# Patient Record
Sex: Female | Born: 1940 | Race: White | Hispanic: No | Marital: Married | Smoking: Current every day smoker
Health system: Southern US, Community
[De-identification: ages and names within clinical notes are randomized; demographics above are authoritative.]

## PROBLEM LIST (undated history)

## (undated) DIAGNOSIS — R002 Palpitations: Secondary | ICD-10-CM

## (undated) DIAGNOSIS — R0600 Dyspnea, unspecified: Secondary | ICD-10-CM

## (undated) DIAGNOSIS — E785 Hyperlipidemia, unspecified: Secondary | ICD-10-CM

## (undated) DIAGNOSIS — I739 Peripheral vascular disease, unspecified: Secondary | ICD-10-CM

## (undated) DIAGNOSIS — Z9289 Personal history of other medical treatment: Secondary | ICD-10-CM

## (undated) DIAGNOSIS — F329 Major depressive disorder, single episode, unspecified: Secondary | ICD-10-CM

## (undated) DIAGNOSIS — B019 Varicella without complication: Secondary | ICD-10-CM

## (undated) DIAGNOSIS — M1990 Unspecified osteoarthritis, unspecified site: Secondary | ICD-10-CM

## (undated) DIAGNOSIS — T7840XA Allergy, unspecified, initial encounter: Secondary | ICD-10-CM

## (undated) DIAGNOSIS — Z720 Tobacco use: Secondary | ICD-10-CM

## (undated) DIAGNOSIS — F32A Depression, unspecified: Secondary | ICD-10-CM

## (undated) HISTORY — DX: Peripheral vascular disease, unspecified: I73.9

## (undated) HISTORY — DX: Personal history of other medical treatment: Z92.89

## (undated) HISTORY — DX: Unspecified osteoarthritis, unspecified site: M19.90

## (undated) HISTORY — DX: Palpitations: R00.2

## (undated) HISTORY — DX: Hyperlipidemia, unspecified: E78.5

## (undated) HISTORY — PX: ABDOMINAL HYSTERECTOMY: SHX81

## (undated) HISTORY — DX: Varicella without complication: B01.9

## (undated) HISTORY — DX: Tobacco use: Z72.0

## (undated) HISTORY — DX: Major depressive disorder, single episode, unspecified: F32.9

## (undated) HISTORY — PX: CHOLECYSTECTOMY: SHX55

## (undated) HISTORY — DX: Depression, unspecified: F32.A

## (undated) HISTORY — DX: Allergy, unspecified, initial encounter: T78.40XA

---

## 2005-09-30 ENCOUNTER — Encounter: Admission: RE | Admit: 2005-09-30 | Discharge: 2005-09-30 | Payer: Self-pay | Admitting: Family Medicine

## 2005-12-10 ENCOUNTER — Other Ambulatory Visit: Admission: RE | Admit: 2005-12-10 | Discharge: 2005-12-10 | Payer: Self-pay | Admitting: Family Medicine

## 2013-01-15 DIAGNOSIS — H60509 Unspecified acute noninfective otitis externa, unspecified ear: Secondary | ICD-10-CM | POA: Diagnosis not present

## 2013-01-31 DIAGNOSIS — H60509 Unspecified acute noninfective otitis externa, unspecified ear: Secondary | ICD-10-CM | POA: Diagnosis not present

## 2013-01-31 DIAGNOSIS — J309 Allergic rhinitis, unspecified: Secondary | ICD-10-CM | POA: Diagnosis not present

## 2014-06-10 DIAGNOSIS — H43819 Vitreous degeneration, unspecified eye: Secondary | ICD-10-CM | POA: Diagnosis not present

## 2014-07-25 DIAGNOSIS — H2510 Age-related nuclear cataract, unspecified eye: Secondary | ICD-10-CM | POA: Diagnosis not present

## 2014-08-14 DIAGNOSIS — H2510 Age-related nuclear cataract, unspecified eye: Secondary | ICD-10-CM | POA: Diagnosis not present

## 2014-09-19 DIAGNOSIS — H9209 Otalgia, unspecified ear: Secondary | ICD-10-CM | POA: Diagnosis not present

## 2014-09-19 DIAGNOSIS — Z23 Encounter for immunization: Secondary | ICD-10-CM | POA: Diagnosis not present

## 2014-10-09 ENCOUNTER — Ambulatory Visit: Payer: Self-pay | Admitting: Ophthalmology

## 2014-10-09 DIAGNOSIS — R0602 Shortness of breath: Secondary | ICD-10-CM | POA: Diagnosis not present

## 2014-10-09 DIAGNOSIS — E780 Pure hypercholesterolemia: Secondary | ICD-10-CM | POA: Diagnosis not present

## 2014-10-09 DIAGNOSIS — I10 Essential (primary) hypertension: Secondary | ICD-10-CM | POA: Diagnosis not present

## 2014-10-09 DIAGNOSIS — Z9889 Other specified postprocedural states: Secondary | ICD-10-CM | POA: Diagnosis not present

## 2014-10-09 DIAGNOSIS — Z01810 Encounter for preprocedural cardiovascular examination: Secondary | ICD-10-CM | POA: Diagnosis not present

## 2014-10-09 DIAGNOSIS — H2510 Age-related nuclear cataract, unspecified eye: Secondary | ICD-10-CM | POA: Diagnosis not present

## 2014-10-09 DIAGNOSIS — Z720 Tobacco use: Secondary | ICD-10-CM | POA: Diagnosis not present

## 2014-10-09 DIAGNOSIS — H2511 Age-related nuclear cataract, right eye: Secondary | ICD-10-CM | POA: Diagnosis not present

## 2014-10-22 ENCOUNTER — Ambulatory Visit: Payer: Self-pay | Admitting: Ophthalmology

## 2014-10-22 DIAGNOSIS — H2511 Age-related nuclear cataract, right eye: Secondary | ICD-10-CM | POA: Diagnosis not present

## 2014-10-22 DIAGNOSIS — R609 Edema, unspecified: Secondary | ICD-10-CM | POA: Diagnosis not present

## 2014-10-22 DIAGNOSIS — E780 Pure hypercholesterolemia: Secondary | ICD-10-CM | POA: Diagnosis not present

## 2014-10-22 DIAGNOSIS — H269 Unspecified cataract: Secondary | ICD-10-CM | POA: Diagnosis not present

## 2014-10-22 DIAGNOSIS — R0602 Shortness of breath: Secondary | ICD-10-CM | POA: Diagnosis not present

## 2014-10-22 DIAGNOSIS — F17200 Nicotine dependence, unspecified, uncomplicated: Secondary | ICD-10-CM | POA: Diagnosis not present

## 2014-10-22 DIAGNOSIS — M1990 Unspecified osteoarthritis, unspecified site: Secondary | ICD-10-CM | POA: Diagnosis not present

## 2014-10-22 DIAGNOSIS — F329 Major depressive disorder, single episode, unspecified: Secondary | ICD-10-CM | POA: Diagnosis not present

## 2014-10-22 DIAGNOSIS — Z79899 Other long term (current) drug therapy: Secondary | ICD-10-CM | POA: Diagnosis not present

## 2014-10-22 HISTORY — PX: CATARACT EXTRACTION: SUR2

## 2014-10-24 ENCOUNTER — Telehealth: Payer: Self-pay | Admitting: Internal Medicine

## 2014-10-24 NOTE — Telephone Encounter (Signed)
Pt made new pt appointment for 12/10/14.  She wanted to know if she could come in sooner.  She stated she had cataract surgery 10/22/14 @ [facility]   Dr Azucena Cecilprofilio @ [facility] and they told her, that her ekg was abnormal .  They told her it looked like she had a slight stroke or heart attack.

## 2014-10-25 NOTE — Telephone Encounter (Signed)
Ok to put her in one of my physical/consults spots in the afternoon so that we can get her in a little earlier.

## 2014-10-29 NOTE — Telephone Encounter (Signed)
Appointment 11/6 °

## 2014-11-01 ENCOUNTER — Ambulatory Visit (INDEPENDENT_AMBULATORY_CARE_PROVIDER_SITE_OTHER): Payer: Medicare Other | Admitting: Internal Medicine

## 2014-11-01 ENCOUNTER — Encounter: Payer: Self-pay | Admitting: Internal Medicine

## 2014-11-01 ENCOUNTER — Encounter (INDEPENDENT_AMBULATORY_CARE_PROVIDER_SITE_OTHER): Payer: Self-pay

## 2014-11-01 VITALS — BP 112/58 | HR 80 | Temp 98.2°F | Ht 66.25 in | Wt 97.0 lb

## 2014-11-01 DIAGNOSIS — Z636 Dependent relative needing care at home: Secondary | ICD-10-CM

## 2014-11-01 DIAGNOSIS — J302 Other seasonal allergic rhinitis: Secondary | ICD-10-CM | POA: Diagnosis not present

## 2014-11-01 DIAGNOSIS — E785 Hyperlipidemia, unspecified: Secondary | ICD-10-CM | POA: Diagnosis not present

## 2014-11-01 DIAGNOSIS — G4700 Insomnia, unspecified: Secondary | ICD-10-CM | POA: Diagnosis not present

## 2014-11-01 DIAGNOSIS — F329 Major depressive disorder, single episode, unspecified: Secondary | ICD-10-CM | POA: Diagnosis not present

## 2014-11-01 DIAGNOSIS — M479 Spondylosis, unspecified: Secondary | ICD-10-CM

## 2014-11-01 DIAGNOSIS — E46 Unspecified protein-calorie malnutrition: Secondary | ICD-10-CM

## 2014-11-01 DIAGNOSIS — F32A Depression, unspecified: Secondary | ICD-10-CM | POA: Insufficient documentation

## 2014-11-01 LAB — COMPREHENSIVE METABOLIC PANEL
ALT: 17 U/L (ref 0–35)
AST: 20 U/L (ref 0–37)
Albumin: 3.8 g/dL (ref 3.5–5.2)
Alkaline Phosphatase: 105 U/L (ref 39–117)
BUN: 11 mg/dL (ref 6–23)
CHLORIDE: 105 meq/L (ref 96–112)
CO2: 28 mEq/L (ref 19–32)
Calcium: 10.5 mg/dL (ref 8.4–10.5)
Creatinine, Ser: 0.9 mg/dL (ref 0.4–1.2)
GFR: 63.56 mL/min (ref >60.00)
Glucose, Bld: 92 mg/dL (ref 70–99)
Potassium: 4.5 mEq/L (ref 3.5–5.1)
Sodium: 140 mEq/L (ref 135–145)
Total Bilirubin: 0.9 mg/dL (ref 0.2–1.2)
Total Protein: 7.1 g/dL (ref 6.0–8.3)

## 2014-11-01 LAB — LIPID PANEL
CHOLESTEROL: 221 mg/dL — AB (ref 0–200)
HDL: 78.8 mg/dL (ref >39.00)
LDL Cholesterol: 127 mg/dL — ABNORMAL HIGH (ref 0–99)
NonHDL: 142.2
Total CHOL/HDL Ratio: 3
Triglycerides: 75 mg/dL (ref 0.0–149.0)
VLDL: 15 mg/dL (ref 0.0–40.0)

## 2014-11-01 LAB — CBC
HCT: 42.5 % (ref 36.0–46.0)
HEMOGLOBIN: 13.9 g/dL (ref 12.0–15.0)
MCHC: 32.7 g/dL (ref 30.0–36.0)
MCV: 97.6 fl (ref 78.0–100.0)
Platelets: 241 10*3/uL (ref 150.0–400.0)
RBC: 4.36 Mil/uL (ref 3.87–5.11)
RDW: 12.7 % (ref 11.5–15.5)
WBC: 8.3 10*3/uL (ref 4.0–10.5)

## 2014-11-01 LAB — TSH: TSH: 1.19 u[IU]/mL (ref 0.35–4.50)

## 2014-11-01 MED ORDER — MIRTAZAPINE 15 MG PO TABS: 15.0000 mg | ORAL_TABLET | Freq: Every day | ORAL | Status: DC

## 2014-11-01 NOTE — Assessment & Plan Note (Addendum)
Related to caregiver stress Support offered today Will check CBC, CMET and TSH Will start low dose remeron  RTC in 1 month for followup depression

## 2014-11-01 NOTE — Assessment & Plan Note (Signed)
Does not take anything OTC for this Declines prescription RX

## 2014-11-01 NOTE — Assessment & Plan Note (Signed)
Will take OTC antihistamine when symptoms present

## 2014-11-01 NOTE — Assessment & Plan Note (Signed)
Related to depression Should improve with remeron

## 2014-11-01 NOTE — Progress Notes (Signed)
HPI  Pt presents to the clinic today to establish care. She has not had a PCP in many years.  Flu: 08/2014 Tetanus: unsure of last one Pneumonia vaccine: 08/2014 Zostovax: never Pap Smear: > 5 years ago Mammogram: > 5 years ago, Hysterectomy Bone Density: > 5 years ago Colon Screening: > 10 years ago Vision Screening: 09/2014- yearly Dentist: no dentures  Arthritis: Mostly in her back. Worse with standing for long periods of time. Does not taken anything for this OTC.  Depression: Is stressed, caring for her sister who has been diagnosed with Alzheimers. She reports that she has been on medication for depression in the past. She was treated with Elavil which seemed to work well for her. Denies SI/HI. She does feel like she would benefit from restarting a medication for depression.  Hyperlipidemia: Has not had this checked in a long time. Does report being on medication in the past, but unable to remember the name. Her appetite consist of mainly healthy foods- rare junk food.  Seasonal Allergies: Takes an antihistamine occassionally for this. It works well for symptom control.  Past Medical History  Diagnosis Date  . Chicken pox   . Allergy   . Arthritis   . Depression   . Hyperlipidemia     No current outpatient prescriptions on file.   No current facility-administered medications for this visit.    No Known Allergies  Family History  Problem Relation Age of Onset  . Hyperlipidemia Mother   . Heart disease Mother   . Hypertension Mother   . Alcohol abuse Sister   . Alzheimer's disease Sister   . Heart disease Sister   . Hypertension Sister   . Alcohol abuse Brother   . Hyperlipidemia Brother   . Heart disease Brother   . Stroke Brother   . Hyperlipidemia Brother     History   Social History  . Marital Status: Single    Spouse Name: N/A    Number of Children: N/A  . Years of Education: N/A   Occupational History  . Not on file.   Social History Main  Topics  . Smoking status: Current Every Day Smoker -- 1.00 packs/day    Types: Cigarettes  . Smokeless tobacco: Never Used  . Alcohol Use: No  . Drug Use: Not on file  . Sexual Activity: Not on file   Other Topics Concern  . Not on file   Social History Narrative  . No narrative on file    ROS:  Constitutional: Pt reports fatigue. Denies fever, malaise, headache or abrupt weight changes.  HEENT: Pt reports itching in her ears. Denies eye pain, eye redness, ear pain, ringing in the ears, wax buildup, runny nose, nasal congestion, bloody nose, or sore throat. Respiratory: Pt reports shortness of breath. Denies difficulty breathing, cough or sputum production.   Cardiovascular: Denies chest pain, chest tightness, palpitations or swelling in the hands or feet.  Gastrointestinal: Pt reports constipation. Denies abdominal pain, bloating, constipation, diarrhea or blood in the stool.  GU: Denies frequency, urgency, pain with urination, blood in urine, odor or discharge. Musculoskeletal: Pt reports arthritis in her back. Denies decrease in range of motion, difficulty with gait, muscle pain or joint swelling.  Skin: Denies redness, rashes, lesions or ulcercations.  Neurological: Pt reports difficulty with memory. Denies dizziness, difficulty with speech or problems with balance and coordination.  Psych: Pt reports depression and stress. Denies anxiety, SI/HI.   No other specific complaints in a complete review of  systems (except as listed in HPI above).  PE:  BP 112/58 mmHg  Pulse 80  Temp(Src) 98.2 F (36.8 C) (Oral)  Ht 5' 6.25" (1.683 m)  Wt 97 lb (43.999 kg)  BMI 15.53 kg/m2  SpO2 98%  Wt Readings from Last 3 Encounters:  11/01/14 97 lb (43.999 kg)    General: Appears her stated age, malnourished but in NAD. HEENT: Head: normal shape and size;  Ears: canal with red/dry skin, Tm's gray and intact, normal light reflex; Throat/Mouth: False teeth present, mucosa pink and moist,  no lesions or ulcerations noted.  Neck: Neck supple, trachea midline. No masses, lumps or thyromegaly present.  Cardiovascular: Normal rate and rhythm. S1,S2 noted.  No murmur, rubs or gallops noted. No JVD or BLE edema. No carotid bruits noted. Pulmonary/Chest: Normal effort and positive vesicular breath sounds. No respiratory distress. No wheezes, rales or ronchi noted.  Abdomen: Soft and nontender. Normal bowel sounds, no bruits noted. No distention or masses noted. Liver, spleen and kidneys non palpable. Musculoskeletal: Normal flexion and extension of the spine. No pain with palpation of the lumbar spine. No difficulty with gait.  Neurological: Alert and oriented.  Psychiatric: Mood tearful and affect flat.    Assessment and Plan:

## 2014-11-01 NOTE — Assessment & Plan Note (Signed)
Has not had this checked in a long time Will check lipid profile today

## 2014-11-01 NOTE — Progress Notes (Signed)
Pre visit review using our clinic review tool, if applicable. No additional management support is needed unless otherwise documented below in the visit note. 

## 2014-11-01 NOTE — Patient Instructions (Signed)

## 2014-11-01 NOTE — Assessment & Plan Note (Signed)
Poor appetite due to depression Should improve with remeron Will check CBC, CMET and TSH

## 2014-11-04 ENCOUNTER — Telehealth: Payer: Self-pay | Admitting: Internal Medicine

## 2014-11-04 NOTE — Telephone Encounter (Signed)
emmi mailed  °

## 2014-12-02 ENCOUNTER — Encounter: Payer: Self-pay | Admitting: Internal Medicine

## 2014-12-02 ENCOUNTER — Ambulatory Visit (INDEPENDENT_AMBULATORY_CARE_PROVIDER_SITE_OTHER): Payer: Medicare Other | Admitting: Internal Medicine

## 2014-12-02 VITALS — BP 118/80 | HR 102 | Temp 97.7°F | Wt 97.0 lb

## 2014-12-02 DIAGNOSIS — G4700 Insomnia, unspecified: Secondary | ICD-10-CM

## 2014-12-02 DIAGNOSIS — F32A Depression, unspecified: Secondary | ICD-10-CM

## 2014-12-02 DIAGNOSIS — F329 Major depressive disorder, single episode, unspecified: Secondary | ICD-10-CM | POA: Diagnosis not present

## 2014-12-02 DIAGNOSIS — E46 Unspecified protein-calorie malnutrition: Secondary | ICD-10-CM | POA: Diagnosis not present

## 2014-12-02 DIAGNOSIS — R9431 Abnormal electrocardiogram [ECG] [EKG]: Secondary | ICD-10-CM | POA: Diagnosis not present

## 2014-12-02 NOTE — Assessment & Plan Note (Signed)
She has not lost weight but she has also not gained weight Will increase remeron to 30 mg QHS

## 2014-12-02 NOTE — Assessment & Plan Note (Signed)
No improvement Will increase remeron to 30 mgQHS 

## 2014-12-02 NOTE — Patient Instructions (Signed)
Insomnia Insomnia is frequent trouble falling and/or staying asleep. Insomnia can be a long term problem or a short term problem. Both are common. Insomnia can be a short term problem when the wakefulness is related to a certain stress or worry. Long term insomnia is often related to ongoing stress during waking hours and/or poor sleeping habits. Overtime, sleep deprivation itself can make the problem worse. Every little thing feels more severe because you are overtired and your ability to cope is decreased. CAUSES   Stress, anxiety, and depression.  Poor sleeping habits.  Distractions such as TV in the bedroom.  Naps close to bedtime.  Engaging in emotionally charged conversations before bed.  Technical reading before sleep.  Alcohol and other sedatives. They may make the problem worse. They can hurt normal sleep patterns and normal dream activity.  Stimulants such as caffeine for several hours prior to bedtime.  Pain syndromes and shortness of breath can cause insomnia.  Exercise late at night.  Changing time zones may cause sleeping problems (jet lag). It is sometimes helpful to have someone observe your sleeping patterns. They should look for periods of not breathing during the night (sleep apnea). They should also look to see how long those periods last. If you live alone or observers are uncertain, you can also be observed at a sleep clinic where your sleep patterns will be professionally monitored. Sleep apnea requires a checkup and treatment. Give your caregivers your medical history. Give your caregivers observations your family has made about your sleep.  SYMPTOMS   Not feeling rested in the morning.  Anxiety and restlessness at bedtime.  Difficulty falling and staying asleep. TREATMENT   Your caregiver may prescribe treatment for an underlying medical disorders. Your caregiver can give advice or help if you are using alcohol or other drugs for self-medication. Treatment  of underlying problems will usually eliminate insomnia problems.  Medications can be prescribed for short time use. They are generally not recommended for lengthy use.  Over-the-counter sleep medicines are not recommended for lengthy use. They can be habit forming.  You can promote easier sleeping by making lifestyle changes such as:  Using relaxation techniques that help with breathing and reduce muscle tension.  Exercising earlier in the day.  Changing your diet and the time of your last meal. No night time snacks.  Establish a regular time to go to bed.  Counseling can help with stressful problems and worry.  Soothing music and white noise may be helpful if there are background noises you cannot remove.  Stop tedious detailed work at least one hour before bedtime. HOME CARE INSTRUCTIONS   Keep a diary. Inform your caregiver about your progress. This includes any medication side effects. See your caregiver regularly. Take note of:  Times when you are asleep.  Times when you are awake during the night.  The quality of your sleep.  How you feel the next day. This information will help your caregiver care for you.  Get out of bed if you are still awake after 15 minutes. Read or do some quiet activity. Keep the lights down. Wait until you feel sleepy and go back to bed.  Keep regular sleeping and waking hours. Avoid naps.  Exercise regularly.  Avoid distractions at bedtime. Distractions include watching television or engaging in any intense or detailed activity like attempting to balance the household checkbook.  Develop a bedtime ritual. Keep a familiar routine of bathing, brushing your teeth, climbing into bed at the same   time each night, listening to soothing music. Routines increase the success of falling to sleep faster.  Use relaxation techniques. This can be using breathing and muscle tension release routines. It can also include visualizing peaceful scenes. You can  also help control troubling or intruding thoughts by keeping your mind occupied with boring or repetitive thoughts like the old concept of counting sheep. You can make it more creative like imagining planting one beautiful flower after another in your backyard garden.  During your day, work to eliminate stress. When this is not possible use some of the previous suggestions to help reduce the anxiety that accompanies stressful situations. MAKE SURE YOU:   Understand these instructions.  Will watch your condition.  Will get help right away if you are not doing well or get worse. Document Released: 12/10/2000 Document Revised: 03/06/2012 Document Reviewed: 01/10/2008 ExitCare Patient Information 2015 [employer]. This information is not intended to replace advice given to you by your health care provider. Make sure you discuss any questions you have with your health care provider.  

## 2014-12-02 NOTE — Progress Notes (Signed)
Subjective:    Patient ID: Pamela Moon, female    DOB: 1941-02-27, 73 y.o.   MRN: [id number]  HPI  Pt presents to the clinic today to follow up depression, insomnia and malnutrition. She was started on remeron at her last visit. Since that time, she has not lost or gained any weight. She is able to fall asleep but she is still waking up 2-3 times per night. She does feel the need to have to get up to use the bathroom. She is not sure what causes her to wake up, she feels like she just can not turn her mind off.The depression is not much better either. She continues to care for her sister who has dementia. This is very stressful for her.  Additionally, she wants to follow up her abnormal ECG. She was told that she had a mild heart attack that was seen on a recent ECG. She occasionally has pains in her chest with exertion but they last only a few minutes. She never followed up on this and would like to follow up on it today while she is here.  Review of Systems      Past Medical History  Diagnosis Date  . Chicken pox   . Allergy   . Arthritis   . Depression   . Hyperlipidemia     Current Outpatient Prescriptions  Medication Sig Dispense Refill  . Difluprednate 0.05 % EMUL Apply 1 drop to eye 2 (two) times daily.    . mirtazapine (REMERON) 15 MG tablet Take 1 tablet (15 mg total) by mouth at bedtime. 30 tablet 0   No current facility-administered medications for this visit.    No Known Allergies  Family History  Problem Relation Age of Onset  . Hyperlipidemia Mother   . Heart disease Mother   . Hypertension Mother   . Alcohol abuse Sister   . Alzheimer's disease Sister   . Heart disease Sister   . Hypertension Sister   . Alcohol abuse Brother   . Hyperlipidemia Brother   . Heart disease Brother   . Stroke Brother   . Hyperlipidemia Brother   . Cancer Neg Hx     History   Social History  . Marital Status: Single    Spouse Name: N/A    Number of Children: N/A    . Years of Education: N/A   Occupational History  . Not on file.   Social History Main Topics  . Smoking status: Current Every Day Smoker -- 1.00 packs/day    Types: Cigarettes  . Smokeless tobacco: Never Used  . Alcohol Use: No  . Drug Use: No  . Sexual Activity: No   Other Topics Concern  . Not on file   Social History Narrative     Constitutional: Pt reports poor appetite. Denies fever, malaise, fatigue, headache.  Respiratory: Denies difficulty breathing, shortness of breath, cough or sputum production.   Cardiovascular: Pt reports chest pain. Denies chest tightness, palpitations or swelling in the hands or feet.  Gastrointestinal: Denies abdominal pain, bloating, constipation, diarrhea or blood in the stool.  Neurological: Denies dizziness, difficulty with memory, difficulty with speech or problems with balance and coordination.  Psych: Pt reports insomnia and depression. Denies SI/HI.  No other specific complaints in a complete review of systems (except as listed in HPI above).  Objective:   Physical Exam  BP 118/80 mmHg  Pulse 102  Temp(Src) 97.7 F (36.5 C) (Oral)  Wt 97 lb (43.999 kg)  SpO2 97% Wt Readings from Last 3 Encounters:  12/02/14 97 lb (43.999 kg)  11/01/14 97 lb (43.999 kg)    General: Appears her stated age, malnourished in NAD. Cardiovascular: Normal rate and rhythm. S1,S2 noted.  No murmur, rubs or gallops noted. No JVD or BLE edema. No carotid bruits noted. Pulmonary/Chest: Normal effort and positive vesicular breath sounds. No respiratory distress. No wheezes, rales or ronchi noted.  Neurological: Alert and oriented.  Psychiatric: Mood tearful today but  affect normal. Behavior is normal. Judgment and thought content normal.     BMET    Component Value Date/Time   NA 140 11/01/2014 1411   K 4.5 11/01/2014 1411   CL 105 11/01/2014 1411   CO2 28 11/01/2014 1411   GLUCOSE 92 11/01/2014 1411   BUN 11 11/01/2014 1411   CREATININE 0.9  11/01/2014 1411   CALCIUM 10.5 11/01/2014 1411    Lipid Panel     Component Value Date/Time   CHOL 221* 11/01/2014 1411   TRIG 75.0 11/01/2014 1411   HDL 78.80 11/01/2014 1411   CHOLHDL 3 11/01/2014 1411   VLDL 15.0 11/01/2014 1411   LDLCALC 127* 11/01/2014 1411    CBC    Component Value Date/Time   WBC 8.3 11/01/2014 1411   RBC 4.36 11/01/2014 1411   HGB 13.9 11/01/2014 1411   HCT 42.5 11/01/2014 1411   PLT 241.0 11/01/2014 1411   MCV 97.6 11/01/2014 1411   MCHC 32.7 11/01/2014 1411   RDW 12.7 11/01/2014 1411    Hgb A1C No results found for: HGBA1C       Assessment & Plan:   Chest pains:  ? 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 Start taking a baby aspirin daily Will refer to cardiology  RTC in 6 months or sooner if needed

## 2014-12-02 NOTE — Progress Notes (Signed)
Pre visit review using our clinic review tool, if applicable. No additional management support is needed unless otherwise documented below in the visit note. 

## 2014-12-02 NOTE — Assessment & Plan Note (Signed)
No improvement Will increase remeron to 30 mgQHS

## 2014-12-03 ENCOUNTER — Telehealth: Payer: Self-pay

## 2014-12-03 NOTE — Telephone Encounter (Signed)
[facility] request cb with new rx for remeron. 12/02/14 visit pt thought was going to increase remeron to 30 mg at hs.Please advise.

## 2014-12-04 ENCOUNTER — Other Ambulatory Visit: Payer: Self-pay | Admitting: Internal Medicine

## 2014-12-04 MED ORDER — MIRTAZAPINE 30 MG PO TABS: 30.0000 mg | ORAL_TABLET | Freq: Every day | ORAL | Status: DC

## 2014-12-04 NOTE — Telephone Encounter (Signed)
I sent this in to her pharmacy  

## 2014-12-10 ENCOUNTER — Ambulatory Visit: Payer: Self-pay | Admitting: Internal Medicine

## 2014-12-19 ENCOUNTER — Encounter: Payer: Self-pay | Admitting: Cardiovascular Disease

## 2014-12-19 ENCOUNTER — Ambulatory Visit (INDEPENDENT_AMBULATORY_CARE_PROVIDER_SITE_OTHER): Payer: Medicare Other | Admitting: Cardiovascular Disease

## 2014-12-19 VITALS — BP 110/64 | HR 75 | Ht 67.0 in | Wt 101.8 lb

## 2014-12-19 DIAGNOSIS — R0789 Other chest pain: Secondary | ICD-10-CM | POA: Diagnosis not present

## 2014-12-19 DIAGNOSIS — R9431 Abnormal electrocardiogram [ECG] [EKG]: Secondary | ICD-10-CM | POA: Diagnosis not present

## 2014-12-19 DIAGNOSIS — E785 Hyperlipidemia, unspecified: Secondary | ICD-10-CM | POA: Diagnosis not present

## 2014-12-19 DIAGNOSIS — I739 Peripheral vascular disease, unspecified: Secondary | ICD-10-CM | POA: Diagnosis not present

## 2014-12-19 DIAGNOSIS — R630 Anorexia: Secondary | ICD-10-CM

## 2014-12-19 DIAGNOSIS — Z8249 Family history of ischemic heart disease and other diseases of the circulatory system: Secondary | ICD-10-CM | POA: Diagnosis not present

## 2014-12-19 DIAGNOSIS — R0602 Shortness of breath: Secondary | ICD-10-CM | POA: Diagnosis not present

## 2014-12-19 MED ORDER — ATORVASTATIN CALCIUM 10 MG PO TABS: 10.0000 mg | ORAL_TABLET | Freq: Every day | ORAL | Status: DC

## 2014-12-19 NOTE — Assessment & Plan Note (Signed)
She's high risk of peripheral arterial disease. Lower extremity Doppler has been ordered

## 2014-12-19 NOTE — Assessment & Plan Note (Signed)
Normal EKG in October, normal EKG today. Suggesting abnormality seen on  recent EKG through primary care could be secondary to lead placement. Stress test has been ordered given her symptoms

## 2014-12-19 NOTE — Assessment & Plan Note (Signed)
Given her long smoking history, high risk of coronary and peripheral disease, recommended she start low-dose Lipitor 10 mg daily for hyperkalemia

## 2014-12-19 NOTE — Assessment & Plan Note (Signed)
Chest pain with some typical as well as atypical symptoms. Stress test ordered as above

## 2014-12-19 NOTE — Progress Notes (Signed)
Patient ID: Pamela Moon, female    DOB: 08/21/1941, 73 y.o.   MRN: [id number]  HPI Comments: Pamela Moon is a pleasant 73 year old woman with 50 year smoking history, 1 pack per day who continues to smoke, anorexia, strong family history of coronary artery disease, who presents for evaluation of abnormal EKG, shortness of breath and chest pain.  Pamela Moon reports having an EKG in October 2015, recent EKG with primary care. Most recently was told her EKG was abnormal. Pamela Moon's been having some shortness of breath with exertion. Feels like her symptoms are getting worse over the past year. Also having some chest tightness with exertion. Difficulty eating, always finds this a struggle. Has been losing weight. Recent told that her cholesterol was elevated. Review of lab work shows total cholesterol 220 In discussion of her chest pain, Pamela Moon describes it as a tightness on the left, sometimes at rest, sometimes with exertion. Her biggest complaint is cramping in her legs, calves sometimes with exertion, sometimes at rest. 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 I suspect this is secondary to abnormal lead placement   No Known Allergies  Outpatient Encounter Prescriptions as of 12/19/2014  Medication Sig  . mirtazapine (REMERON) 30 MG tablet Take 1 tablet (30 mg total) by mouth at bedtime.  Marland Kitchen. atorvastatin (LIPITOR) 10 MG tablet Take 1 tablet (10 mg total) by mouth daily.  . [DISCONTINUED] Difluprednate 0.05 % EMUL Apply 1 drop to eye 2 (two) times daily.    Past Medical History  Diagnosis Date  . Chicken pox   . Allergy   . Arthritis   . Depression   . Hyperlipidemia     Past Surgical History  Procedure Laterality Date  . Cholecystectomy    .  Abdominal hysterectomy    . Cataract extraction Right 10/22/2014    Social History  reports that Pamela Moon has been smoking Cigarettes.  Pamela Moon has a 50 pack-year smoking history. Pamela Moon has never used smokeless tobacco. Pamela Moon reports that Pamela Moon does not drink alcohol or use illicit drugs.  Family History family history includes Alcohol abuse in her brother and sister; Alzheimer's disease in her sister; Heart disease in her brother, mother, and sister; Hyperlipidemia in her brother, brother, and mother; Hypertension in her mother and sister; Stroke in her brother. There is no history of Cancer.    Review of Systems  Constitutional: Negative.   HENT: Negative.   Eyes: Negative.   Respiratory: Positive for shortness of breath.   Cardiovascular: Positive for chest pain.  Gastrointestinal: Negative.   Endocrine: Negative.   Musculoskeletal: Positive for myalgias.  Skin: Negative.   Allergic/Immunologic: Negative.   Neurological: Negative.   Hematological: Negative.   Psychiatric/Behavioral: Negative.   All other systems reviewed and are negative.   BP 110/64 mmHg  Pulse 75  Ht 5\' 7"  (1.702 m)  Wt 101 lb 12 oz (46.153 kg)  BMI 15.93 kg/m2  Physical Exam  Constitutional: Pamela Moon is oriented to person, place, and time. Pamela Moon appears well-developed and well-nourished.  HENT:  Head: Normocephalic.  Nose: Nose normal.  Mouth/Throat: Oropharynx is clear and moist.  Eyes: Conjunctivae are normal. Pupils are equal, round, and reactive to light.  Neck: Normal range of motion. Neck supple. No JVD present.  Cardiovascular: Normal rate,  regular rhythm, S1 normal, S2 normal, normal heart sounds and intact distal pulses.  Exam reveals no gallop and no friction rub.   No murmur heard. Pulmonary/Chest: Effort normal and breath sounds normal. No respiratory distress. Pamela Moon has no wheezes. Pamela Moon has no rales. Pamela Moon exhibits no tenderness.  Abdominal: Soft. Bowel sounds are normal. Pamela Moon exhibits no distension. There is no  tenderness.  Musculoskeletal: Normal range of motion. Pamela Moon exhibits no edema or tenderness.  Lymphadenopathy:    Pamela Moon has no cervical adenopathy.  Neurological: Pamela Moon is alert and oriented to person, place, and time. Coordination normal.  Skin: Skin is warm and dry. No rash noted. No erythema.  Psychiatric: Pamela Moon has a normal mood and affect. Her behavior is normal. Judgment and thought content normal.    Assessment and Plan  Nursing note and vitals reviewed.

## 2014-12-19 NOTE — Patient Instructions (Addendum)
You are doing well.  We will schedule a stress test for shortness of breath  We will schedule a leg ultrasound for cramping  Please start atorvastatin one a day for cholesterol  Please call us if you have new issues that need to be addressed before your next appt.         [facility] MYOVIEW  Your caregiver has ordered a Stress Test with nuclear imaging. The purpose of this test is to evaluate the blood supply to your heart muscle. This procedure is referred to as a "Non-Invasive Stress Test." This is because other than having an IV started in your vein, nothing is inserted or "invades" your body. Cardiac stress tests are done to find areas of poor blood flow to the heart by determining the extent of coronary artery disease (CAD). Some patients exercise on a treadmill, which naturally increases the blood flow to your heart, while others who are  unable to walk on a treadmill due to physical limitations have a pharmacologic/chemical stress agent called Lexiscan . This medicine will mimic walking on a treadmill by temporarily increasing your coronary blood flow.   Please note: these test may take anywhere between 2-4 hours to complete  [facility]  THE VOLUNTEERS AT THE FIRST DESK WILL DIRECT YOU WHERE TO GO  Date of Procedure:_____Friday, January 8_______  Arrival Time for Procedure:____7:45 am__________  PLEASE NOTIFY THE OFFICE AT LEAST 24 HOURS IN ADVANCE IF YOU ARE UNABLE TO KEEP YOUR APPOINTMENT.  [phone] AND  PLEASE NOTIFY [facility] AT [facility] AT LEAST 24 HOURS IN ADVANCE IF YOU ARE UNABLE TO KEEP YOUR APPOINTMENT. [phone]  How to prepare for your Myoview test:  1. Do not eat or drink after midnight 2. No caffeine for 24 hours prior to test 3. No smoking 24 hours prior to test. 4. Your medication may be taken with water.  If your doctor stopped a medication because of this test, do not take that medication. 5. Ladies, please do not  wear dresses.  Skirts or pants are appropriate. Please wear a short sleeve shirt. 6. No perfume, cologne or lotion. 7. Wear comfortable walking shoes. No heels!

## 2014-12-19 NOTE — Assessment & Plan Note (Signed)
Etiology of her shortness of breath is unclear. Unable to exclude underlying COPD. High risk of coronary artery disease given her smoking history, hyperlipidemia. Given her symptoms, stress test has been ordered to rule out ischemia.

## 2015-01-03 ENCOUNTER — Ambulatory Visit: Payer: Self-pay | Admitting: Cardiovascular Disease

## 2015-01-03 ENCOUNTER — Other Ambulatory Visit: Payer: Self-pay

## 2015-01-03 DIAGNOSIS — R0789 Other chest pain: Secondary | ICD-10-CM

## 2015-01-03 DIAGNOSIS — R079 Chest pain, unspecified: Secondary | ICD-10-CM

## 2015-01-03 DIAGNOSIS — R0602 Shortness of breath: Secondary | ICD-10-CM

## 2015-01-10 ENCOUNTER — Other Ambulatory Visit (HOSPITAL_COMMUNITY): Payer: Self-pay | Admitting: Cardiology

## 2015-01-10 ENCOUNTER — Encounter (INDEPENDENT_AMBULATORY_CARE_PROVIDER_SITE_OTHER): Payer: Medicare Other

## 2015-01-10 DIAGNOSIS — I739 Peripheral vascular disease, unspecified: Secondary | ICD-10-CM

## 2015-02-04 ENCOUNTER — Ambulatory Visit (INDEPENDENT_AMBULATORY_CARE_PROVIDER_SITE_OTHER): Payer: Medicare Other | Admitting: Cardiovascular Disease

## 2015-02-04 ENCOUNTER — Encounter: Payer: Self-pay | Admitting: Cardiovascular Disease

## 2015-02-04 VITALS — BP 114/78 | HR 80 | Ht 66.0 in | Wt 106.2 lb

## 2015-02-04 DIAGNOSIS — Z720 Tobacco use: Secondary | ICD-10-CM

## 2015-02-04 DIAGNOSIS — E785 Hyperlipidemia, unspecified: Secondary | ICD-10-CM | POA: Diagnosis not present

## 2015-02-04 DIAGNOSIS — I739 Peripheral vascular disease, unspecified: Secondary | ICD-10-CM | POA: Diagnosis not present

## 2015-02-04 MED ORDER — CILOSTAZOL 50 MG PO TABS: 50.0000 mg | ORAL_TABLET | Freq: Two times a day (BID) | ORAL | Status: DC

## 2015-02-04 NOTE — Assessment & Plan Note (Signed)
Continue treatment with atorvastatin. I recommend a target LDL of less than 70. 

## 2015-02-04 NOTE — Assessment & Plan Note (Signed)
I had a prolonged discussion with her about the association of peripheral arterial disease and tobacco use. Unfortunately, she does not think she can quit smoking.

## 2015-02-04 NOTE — Assessment & Plan Note (Signed)
The patient has moderate bilateral calf claudication. The disease in the SFA seems worse on the right side. However, she reports equal symptoms on both sides. 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 I discussed with him the natural history of claudication and strongly advised her to quit smoking. I encouraged her to  start a walking program. I also started her on cilostazol. I will reevaluate symptoms in 3 months and consider invasive angiography if no improvement.

## 2015-02-04 NOTE — Patient Instructions (Signed)
Start Cilostazol (Pletal) 50 mg twice daily.   Follow up in 3 months.

## 2015-02-04 NOTE — Progress Notes (Signed)
HPI  Pamela Moon is a pleasant 74 year old woman who was referred by Dr. Mariah Milling for evaluation and management of peripheral arterial disease. She has a 50 year smoking history, 1 pack per day who continues to smoke, anorexia, and strong family history of coronary artery disease. He was seen recently for atypical chest pain and exertional dyspnea. She underwent a nuclear stress test which showed no evidence of ischemia with normal ejection fraction.  She reported bilateral leg cramping and was found to have diminished distal pulses. Thus, she was referred for lower extremity arterial Doppler which showed an ABI of 0.78 on the right and 0.89 on the left. There was possible inflow disease on the left side. There was a focal greater than 50% stenosis in the mid right SFA with a peak velocity of 320.  The patient reports equal bilateral calf claudication with variable distance of walking. She seems to be bothered more right chronic back pain related to degenerative disc disease. She cannot stand in one position for a long time and has to move around.    No Known Allergies   Current Outpatient Prescriptions on File Prior to Visit  Medication Sig Dispense Refill  . atorvastatin (LIPITOR) 10 MG tablet Take 1 tablet (10 mg total) by mouth daily. 30 tablet 11  . mirtazapine (REMERON) 30 MG tablet Take 1 tablet (30 mg total) by mouth at bedtime. 30 tablet 2   No current facility-administered medications on file prior to visit.     Past Medical History  Diagnosis Date  . Chicken pox   . Allergy   . Arthritis   . Depression   . Hyperlipidemia      Past Surgical History  Procedure Laterality Date  . Cholecystectomy    . Abdominal hysterectomy    . Cataract extraction Right 10/22/2014     Family History  Problem Relation Age of Onset  . Hyperlipidemia Mother   . Heart disease Mother   . Hypertension Mother   . Alcohol abuse Sister   . Alzheimer's disease Sister   . Heart disease  Sister   . Hypertension Sister   . Alcohol abuse Brother   . Hyperlipidemia Brother   . Heart disease Brother   . Stroke Brother   . Hyperlipidemia Brother   . Cancer Neg Hx      History   Social History  . Marital Status: Married    Spouse Name: N/A    Number of Children: N/A  . Years of Education: N/A   Occupational History  . Not on file.   Social History Main Topics  . Smoking status: Current Every Day Smoker -- 1.00 packs/day for 50 years    Types: Cigarettes  . Smokeless tobacco: Never Used  . Alcohol Use: No  . Drug Use: No  . Sexual Activity: No   Other Topics Concern  . Not on file   Social History Narrative     ROS A 10 point review of system was performed. It is negative other than that mentioned in the history of present illness.   PHYSICAL EXAM   BP 114/78 mmHg  Pulse 80  Ht  (1.676 m)  Wt 106 lb 4 oz (48.195 kg)  BMI 17.16 kg/m2 Constitutional: She is oriented to person, place, and time. She appears underweight. No distress.  HENT: No nasal discharge.  Head: Normocephalic and atraumatic.  Eyes: Pupils are equal and round. No discharge.  Neck: Normal range of motion. Neck supple. No JVD  present. No thyromegaly present.  Cardiovascular: Normal rate, regular rhythm, normal heart sounds. Exam reveals no gallop and no friction rub. No murmur heard.  Pulmonary/Chest: Effort normal and breath sounds normal. No stridor. No respiratory distress. She has no wheezes. She has no rales. She exhibits no tenderness.  Abdominal: Soft. Bowel sounds are normal. She exhibits no distension. There is no tenderness. There is no rebound and no guarding.  Musculoskeletal: Normal range of motion. She exhibits no edema and no tenderness.  Neurological: She is alert and oriented to person, place, and time. Coordination normal.  Skin: Skin is warm and dry. No rash noted. She is not diaphoretic. No erythema. No pallor.  Psychiatric: She has a normal mood and affect.  Her behavior is normal. Judgment and thought content normal.  Vascular: Radial pulses are normal bilaterally. Femoral pulses: +2 on the right and +1 on the left. Distal pulses are not palpable.       ASSESSMENT AND PLAN

## 2015-04-19 NOTE — Op Note (Signed)
PATIENT NAME:  Pamela Moon, Pamela Moon DATE OF BIRTH:  Oct 13, 1941  DATE OF PROCEDURE:  10/22/2014  PREOPERATIVE DIAGNOSIS:  Nuclear sclerotic cataract of the right eye.   POSTOPERATIVE DIAGNOSIS:  Nuclear sclerotic cataract of the right eye.   OPERATIVE PROCEDURE:  Cataract extraction by phacoemulsification with implant of intraocular lens to right eye.   SURGEON:  Jerilee FieldWilliam L. Shilo Pauwels, MD  ANESTHESIA:  1. Managed anesthesia care.  2. Topical tetracaine drops followed by 2% Xylocaine jelly applied in the preoperative holding area.   COMPLICATIONS:  None.   TECHNIQUE:   Stop and chop.  DESCRIPTION OF PROCEDURE:  The patient was examined and consented in the preoperative holding area where the aforementioned topical anesthesia was applied to the right eye and then brought back to the Operating Room where the right eye was prepped and draped in the usual sterile ophthalmic fashion and a lid speculum was placed. A paracentesis was created with the side port blade and the anterior chamber was filled with viscoelastic. A near clear corneal incision was performed with the steel keratome. A continuous curvilinear capsulorrhexis was performed with a cystotome followed by the capsulorrhexis forceps. Hydrodissection and hydrodelineation were carried out with BSS on a blunt cannula. The lens was removed in a stop and chop technique and the remaining cortical material was removed with the irrigation-aspiration handpiece. The capsular bag was inflated with viscoelastic and the Tecnis ZCB00 22.0-diopter lens, serial number [id number], was placed in the capsular bag without complication. The remaining viscoelastic was removed from the eye with the irrigation-aspiration handpiece. The wounds were hydrated. The anterior chamber was flushed with Miostat and the eye was inflated to physiologic pressure. 0.1 mL of cefuroxime concentration 10 mg/mL was placed in the anterior chamber. The wounds were found to  be water tight. The eye was dressed with Vigamox. The patient was given protective glasses to wear throughout the day and a shield with which to sleep tonight. The patient was also given drops with which to begin a drop regimen today and will follow up with me in one day.    ____________________________ Jerilee FieldWilliam L. Cheree Fowles, MD wlp:nb D: 10/22/2014 15:41:31 ET T: 10/23/2014 03:59:38 ET JOB#: [id number]  cc: Duwayne Matters L. Mehki Klumpp, MD, <Dictator> Jerilee FieldWILLIAM L Kiyanna Biegler MD ELECTRONICALLY SIGNED 10/23/2014 14:13

## 2015-05-05 ENCOUNTER — Encounter: Payer: Self-pay | Admitting: Cardiovascular Disease

## 2015-05-05 ENCOUNTER — Ambulatory Visit (INDEPENDENT_AMBULATORY_CARE_PROVIDER_SITE_OTHER): Payer: Medicare Other | Admitting: Cardiovascular Disease

## 2015-05-05 VITALS — BP 122/80 | HR 100 | Ht 66.0 in | Wt 104.0 lb

## 2015-05-05 DIAGNOSIS — Z720 Tobacco use: Secondary | ICD-10-CM | POA: Diagnosis not present

## 2015-05-05 DIAGNOSIS — I739 Peripheral vascular disease, unspecified: Secondary | ICD-10-CM | POA: Diagnosis not present

## 2015-05-05 DIAGNOSIS — E785 Hyperlipidemia, unspecified: Secondary | ICD-10-CM

## 2015-05-05 NOTE — Assessment & Plan Note (Signed)
I again discussed with her the importance of smoking cessation. She reports inability to quit at the present time. 

## 2015-05-05 NOTE — Assessment & Plan Note (Signed)
Continue treatment with atorvastatin. Recommend a target LDL of less than 70. Consider increasing the dose if that's not achieved on the next fasting lipid profile.

## 2015-05-05 NOTE — Patient Instructions (Signed)
Continue same medications.   Your physician wants you to follow-up in: 6 months.  You will receive a reminder letter in the mail two months in advance. If you don't receive a letter, please call our office to schedule the follow-up appointment.  

## 2015-05-05 NOTE — Progress Notes (Signed)
HPI  Pamela Moon is a pleasant 74 year old woman who is here today for a follow-up visit regarding  peripheral arterial disease. She has a 50 year smoking history, 1 pack per day who continues to smoke, anorexia, and strong family history of coronary artery disease. Nuclear stress test in January of this year  showed no evidence of ischemia with normal ejection fraction.  She reported bilateral leg cramping and was found to have diminished distal pulses. Thus, she was referred for lower extremity arterial Doppler which showed an ABI of 0.78 on the right and 0.89 on the left. There was possible inflow disease on the left side. There was a focal greater than 50% stenosis in the mid right SFA with a peak velocity of 320.   She seems to be bothered more by chronic back pain related to degenerative disc disease. She cannot stand in one position for a long time and has to move around.  During last visit, she was started on small dose cilostazol. No significant change in her symptoms. She is able to walk more than half a mile without significant claudications. Most of her cramps are at night.    No Known Allergies   Current Outpatient Prescriptions on File Prior to Visit  Medication Sig Dispense Refill  . aspirin 81 MG tablet Take 81 mg by mouth daily.    Marland Kitchen. atorvastatin (LIPITOR) 10 MG tablet Take 1 tablet (10 mg total) by mouth daily. 30 tablet 11  . cilostazol (PLETAL) 50 MG tablet Take 1 tablet (50 mg total) by mouth 2 (two) times daily. 60 tablet 6   No current facility-administered medications on file prior to visit.     Past Medical History  Diagnosis Date  . Chicken pox   . Allergy   . Arthritis   . Depression   . Hyperlipidemia      Past Surgical History  Procedure Laterality Date  . Cholecystectomy    . Abdominal hysterectomy    . Cataract extraction Right 10/22/2014     Family History  Problem Relation Age of Onset  . Hyperlipidemia Mother   . Heart disease Mother     . Hypertension Mother   . Alcohol abuse Sister   . Alzheimer's disease Sister   . Heart disease Sister   . Hypertension Sister   . Alcohol abuse Brother   . Hyperlipidemia Brother   . Heart disease Brother   . Stroke Brother   . Hyperlipidemia Brother   . Cancer Neg Hx      History   Social History  . Marital Status: Married    Spouse Name: N/A  . Number of Children: N/A  . Years of Education: N/A   Occupational History  . Not on file.   Social History Main Topics  . Smoking status: Current Every Day Smoker -- 1.00 packs/day for 50 years    Types: Cigarettes  . Smokeless tobacco: Never Used  . Alcohol Use: No  . Drug Use: No  . Sexual Activity: No   Other Topics Concern  . Not on file   Social History Narrative     ROS A 10 point review of system was performed. It is negative other than that mentioned in the history of present illness.   PHYSICAL EXAM   BP 122/80 mmHg  Pulse 100  Ht 5\' 6"  (1.676 m)  Wt 104 lb (47.174 kg)  BMI 16.79 kg/m2 Constitutional: She is oriented to person, place, and time. She appears underweight. No  distress.  HENT: No nasal discharge.  Head: Normocephalic and atraumatic.  Eyes: Pupils are equal and round. No discharge.  Neck: Normal range of motion. Neck supple. No JVD present. No thyromegaly present.  Cardiovascular: Normal rate, regular rhythm, normal heart sounds. Exam reveals no gallop and no friction rub. No murmur heard.  Pulmonary/Chest: Effort normal and breath sounds normal. No stridor. No respiratory distress. She has no wheezes. She has no rales. She exhibits no tenderness.  Abdominal: Soft. Bowel sounds are normal. She exhibits no distension. There is no tenderness. There is no rebound and no guarding.  Musculoskeletal: Normal range of motion. She exhibits no edema and no tenderness.  Neurological: She is alert and oriented to person, place, and time. Coordination normal.  Skin: Skin is warm and dry. No rash noted.  She is not diaphoretic. No erythema. No pallor.  Psychiatric: She has a normal mood and affect. Her behavior is normal. Judgment and thought content normal.  Vascular: Radial pulses are normal bilaterally. Femoral pulses: +2 on the right and +1 on the left. Distal pulses are not palpable.       ASSESSMENT AND PLAN

## 2015-05-05 NOTE — Assessment & Plan Note (Signed)
The patient has moderate bilateral calf claudication.  She also has a lot of neuropathic pain as well related to degenerative disc disease in her back. Given that her symptoms are stable and not lifestyle limiting, I recommend continuing medical therapy and treating risk factors.

## 2015-05-28 DIAGNOSIS — Z961 Presence of intraocular lens: Secondary | ICD-10-CM | POA: Diagnosis not present

## 2015-06-05 ENCOUNTER — Encounter: Payer: Self-pay | Admitting: Internal Medicine

## 2015-07-22 ENCOUNTER — Telehealth: Payer: Self-pay

## 2015-07-22 NOTE — Telephone Encounter (Signed)
Patient declined any information about scheduling a Mammogram.  

## 2016-01-26 ENCOUNTER — Encounter: Payer: Self-pay | Admitting: Physician Assistant

## 2016-01-27 ENCOUNTER — Encounter: Payer: Self-pay | Admitting: Physician Assistant

## 2016-01-27 ENCOUNTER — Ambulatory Visit (INDEPENDENT_AMBULATORY_CARE_PROVIDER_SITE_OTHER): Payer: Medicare Other | Admitting: Physician Assistant

## 2016-01-27 VITALS — BP 138/88 | HR 79 | Ht 66.0 in | Wt 101.0 lb

## 2016-01-27 DIAGNOSIS — E785 Hyperlipidemia, unspecified: Secondary | ICD-10-CM | POA: Diagnosis not present

## 2016-01-27 DIAGNOSIS — R002 Palpitations: Secondary | ICD-10-CM | POA: Diagnosis not present

## 2016-01-27 DIAGNOSIS — I739 Peripheral vascular disease, unspecified: Secondary | ICD-10-CM

## 2016-01-27 DIAGNOSIS — R0602 Shortness of breath: Secondary | ICD-10-CM | POA: Diagnosis not present

## 2016-01-27 NOTE — Progress Notes (Signed)
Cardiology Office Note Date:  01/27/2016  Patient ID:  Pamela Moon, Pamela Moon 04/05/1941, MRN [id number] PCP:  Nicki Reaper, NP  Cardiologist:  Dr. Kirke Corin, MD    Chief Complaint: PAD follow up, some dizziness and palpitations   History of Present Illness: Pamela Moon is a 75 y.o. female with history of PAD medically managed on Pletal, ongoing tobacco abuse with 50 pack year history who smokes 1 pack per day, anorexia, strong family history of CAD, and chronic back pain who presents for routine follow up of her PAD. Nuclear stress test in January of 2016showed no evidence of ischemia with normal ejection fraction. She reported bilateral leg cramping and was found to have diminished distal pulses. Thus, she was referred for lower extremity arterial Doppler which showed an ABI of 0.78 on the right and 0.89 on the left. There was possible inflow disease on the left side. There was a focal greater than 50% stenosis in the mid right SFA with a peak velocity of 320. She has been bothered by chronic back pain 2/2 DDD. At her follow up on 05/05/2015 her LE cramps were mostly at night. She was continued on medical therapy. She discontinued Pletal and Lipitor in the summer of 2016 secondary to dizziness. Since stopping these medications she no longer has dizziness. Her claudication symptoms have remained stable. She cannot stand in one place for an extended time period, not can she walk in a store without developing a "hot" sensation in her legs. She also notes some palpitations that occur about once per week. No associated dizziness or presyncope. Symptoms last a couple minutes and self resolve. She continues to smoke 1 pack daily and is not ready to quit.    Past Medical History  Diagnosis Date  . Chicken pox   . Allergy   . Arthritis   . Depression   . Hyperlipidemia   . PAD (peripheral artery disease) (HCC)     a. LE arterial doppler 12/2014: ABI of 0.78 on the right and 0.89 on the left, possible  inflow disease on the left side. There was a focal greater than 50% stenosis in the mid right SFA with a peak velocity of 320  . History of nuclear stress test     a. 12/2014: no ischemia, nl EF  . Tobacco abuse     a. ongoing, 50+ pack years    Past Surgical History  Procedure Laterality Date  . Cholecystectomy    . Abdominal hysterectomy    . Cataract extraction Right 10/22/2014    Current Outpatient Prescriptions  Medication Sig Dispense Refill  . aspirin 81 MG tablet Take 81 mg by mouth daily.    . cholecalciferol (VITAMIN D) 400 units TABS tablet Take 400 Units by mouth daily.    . niacin 500 MG tablet Take 500 mg by mouth at bedtime.     No current facility-administered medications for this visit.    Allergies:   Review of patient's allergies indicates no known allergies.   Social History:  The patient  reports that she has been smoking Cigarettes.  She has a 50 pack-year smoking history. She has never used smokeless tobacco. She reports that she does not drink alcohol or use illicit drugs.   Family History:  The patient's family history includes Alcohol abuse in her brother and sister; Alzheimer's disease in her sister; Heart disease in her brother, mother, and sister; Hyperlipidemia in her brother, brother, and mother; Hypertension in her mother and sister;  Stroke in her brother. There is no history of Cancer.  ROS:   Review of Systems  Constitutional: Positive for malaise/fatigue. Negative for fever, chills, weight loss and diaphoresis.  HENT: Negative for congestion.   Eyes: Negative for discharge and redness.  Respiratory: Positive for shortness of breath. Negative for cough, hemoptysis, sputum production and wheezing.   Cardiovascular: Positive for palpitations. Negative for chest pain, orthopnea, claudication, leg swelling and PND.  Gastrointestinal: Negative for nausea, vomiting and abdominal pain.  Musculoskeletal: Positive for myalgias and back pain. Negative for  joint pain, falls and neck pain.  Skin: Negative for rash.  Neurological: Positive for dizziness. Negative for tingling, tremors, sensory change, speech change, focal weakness, seizures, loss of consciousness and weakness.  Endo/Heme/Allergies: Bruises/bleeds easily.  Psychiatric/Behavioral: The patient is not nervous/anxious.     PHYSICAL EXAM:  VS:  BP 138/88 mmHg  Pulse 79  Ht  (1.676 m)  Wt 101 lb (45.813 kg)  BMI 16.31 kg/m2 BMI: Body mass index is 16.31 kg/(m^2). Well nourished, well developed, in no acute distress HEENT: normocephalic, atraumatic Neck: no JVD, carotid bruits or masses Cardiac:  normal S1, S2; RRR; no murmurs, rubs, or gallops Lungs:  clear to auscultation bilaterally, no wheezing, rhonchi or rales Abd: soft, nontender, no hepatomegaly, + BS MS: no deformity or atrophy Ext: no edema Skin: warm and dry, no rash Neuro:  moves all extremities spontaneously, no focal abnormalities noted, follows commands Psych: euthymic mood, full affect Vascular: Femoral pulses: 2+ on the right and 1+ on the left. No distal lower extremity pulses. 2+ bilateral radial pulses.     EKG:  Was ordered today. Shows NSR, 79 bpm, left anterior fascicular block, poor R wave progression, wandering baseline V3, no significant st/t changes   Recent Labs: No results found for requested labs within last 365 days.  No results found for requested labs within last 365 days.   CrCl cannot be calculated (Patient has no serum creatinine result on file.).   Wt Readings from Last 3 Encounters:  01/27/16 101 lb (45.813 kg)  05/05/15 104 lb (47.174 kg)  02/04/15 106 lb 4 oz (48.195 kg)     Other studies reviewed: Additional studies/records reviewed today include: summarized above  ASSESSMENT AND PLAN:  1. PAD: -Check lower extremity doppler -She stopped her Pletal in the summer of 2016 and does not want to restart this medication as it led to dizziness -Continue aspirin 81 mg  daily  2. HLD: -Restart Lipitor 40 mg daily -Check cmet and lipid  3. Tobacco abuse: -Cessation advised -Offered assistance, patient declined  4. Palpitations: -Check 14 day event monitor -Orthostatics were negative   Disposition: F/u with Dr. Kirke Corin in 2-4 weeks  Current medicines are reviewed at length with the patient today.  The patient did not have any concerns regarding medicines.  Elinor Dodge PA-C 01/27/2016 2:33 PM     [facility] [place] [address] [place], [place] [address] [phone]

## 2016-01-27 NOTE — Patient Instructions (Signed)
Medication Instructions:  Continue current medications.   Labwork: CMET & LIPID   Testing/Procedures: LE Arterial   Your physician has requested that you have a lower extremity arterial exercise duplex. During this test, exercise and ultrasound are used to evaluate arterial blood flow in the legs. Allow one hour for this exam. There are no restrictions or special instructions.  Your physician has recommended that you wear a holter monitor. Holter monitors are medical devices that record the heart's electrical activity. Doctors most often use these monitors to diagnose arrhythmias. Arrhythmias are problems with the speed or rhythm of the heartbeat. The monitor is a small, portable device. You can wear one while you do your normal daily activities. This is usually used to diagnose what is causing palpitations/syncope (passing out).  Follow-Up: Follow with Dr. Kirke Corin after 14 day event monitor.    Any Other Special Instructions Will Be Listed Below (If Applicable).     If you need a refill on your cardiac medications before your next appointment, please call your pharmacy.  Holter Monitoring A Holter monitor is a small device that is used to detect abnormal heart rhythms. It clips to your clothing and is connected by wires to flat, sticky disks (electrodes) that attach to your chest. It is worn continuously for 24-48 hours. HOME CARE INSTRUCTIONS  Wear your Holter monitor at all times, even while exercising and sleeping, for as long as directed by your health care provider.  Make sure that the Holter monitor is safely clipped to your clothing or close to your body as recommended by your health care provider.  Do not get the monitor or wires wet.  Do not put body lotion or moisturizer on your chest.  Keep your skin clean.  Keep a diary of your daily activities, such as walking and doing chores. If you feel that your heartbeat is abnormal or that your heart is fluttering or skipping a  beat:  Record what you are doing when it happens.  Record what time of day the symptoms occur.  Return your Holter monitor as directed by your health care provider.  Keep all follow-up visits as directed by your health care provider. This is important. SEEK IMMEDIATE MEDICAL CARE IF:  You feel lightheaded or you faint.  You have trouble breathing.  You feel pain in your chest, upper arm, or jaw.  You feel sick to your stomach and your skin is pale, cool, or damp.  You heartbeat feels unusual or abnormal.   This information is not intended to replace advice given to you by your health care provider. Make sure you discuss any questions you have with your health care provider.   Document Released: 09/10/2004 Document Revised: 01/03/2015 Document Reviewed: 07/22/2014 Elsevier Interactive Patient Education [employer]

## 2016-01-28 ENCOUNTER — Other Ambulatory Visit: Payer: Self-pay

## 2016-01-28 LAB — COMPREHENSIVE METABOLIC PANEL
ALK PHOS: 125 IU/L — AB (ref 39–117)
ALT: 17 IU/L (ref 0–32)
AST: 21 IU/L (ref 0–40)
Albumin/Globulin Ratio: 1.9 (ref 1.1–2.5)
Albumin: 4.6 g/dL (ref 3.5–4.8)
BUN/Creatinine Ratio: 15 (ref 11–26)
BUN: 13 mg/dL (ref 8–27)
Bilirubin Total: 0.3 mg/dL (ref 0.0–1.2)
CO2: 22 mmol/L (ref 18–29)
CREATININE: 0.87 mg/dL (ref 0.57–1.00)
Calcium: 10.9 mg/dL — ABNORMAL HIGH (ref 8.7–10.3)
Chloride: 103 mmol/L (ref 96–106)
GFR calc Af Amer: 76 mL/min/{1.73_m2} (ref >59)
GFR calc non Af Amer: 66 mL/min/{1.73_m2} (ref >59)
GLUCOSE: 89 mg/dL (ref 65–99)
Globulin, Total: 2.4 g/dL (ref 1.5–4.5)
Potassium: 5.2 mmol/L (ref 3.5–5.2)
Sodium: 144 mmol/L (ref 134–144)
Total Protein: 7 g/dL (ref 6.0–8.5)

## 2016-01-28 LAB — LIPID PANEL
Chol/HDL Ratio: 2.4 ratio units (ref 0.0–4.4)
Cholesterol, Total: 203 mg/dL — ABNORMAL HIGH (ref 100–199)
HDL: 86 mg/dL (ref >39)
LDL Calculated: 96 mg/dL (ref 0–99)
Triglycerides: 107 mg/dL (ref 0–149)
VLDL CHOLESTEROL CAL: 21 mg/dL (ref 5–40)

## 2016-01-28 MED ORDER — ATORVASTATIN CALCIUM 40 MG PO TABS: 40.0000 mg | ORAL_TABLET | Freq: Every day | ORAL | Status: DC

## 2016-01-30 ENCOUNTER — Other Ambulatory Visit: Payer: Self-pay | Admitting: Cardiovascular Disease

## 2016-01-30 DIAGNOSIS — I739 Peripheral vascular disease, unspecified: Secondary | ICD-10-CM

## 2016-02-03 ENCOUNTER — Ambulatory Visit (INDEPENDENT_AMBULATORY_CARE_PROVIDER_SITE_OTHER): Payer: Medicare Other

## 2016-02-03 DIAGNOSIS — R002 Palpitations: Secondary | ICD-10-CM

## 2016-02-04 ENCOUNTER — Ambulatory Visit: Payer: Medicare Other

## 2016-02-04 DIAGNOSIS — I739 Peripheral vascular disease, unspecified: Secondary | ICD-10-CM | POA: Diagnosis not present

## 2016-02-19 ENCOUNTER — Other Ambulatory Visit: Payer: Self-pay

## 2016-02-20 ENCOUNTER — Other Ambulatory Visit: Payer: Self-pay

## 2016-02-20 DIAGNOSIS — R002 Palpitations: Secondary | ICD-10-CM

## 2016-02-24 ENCOUNTER — Ambulatory Visit: Payer: Self-pay | Admitting: Cardiovascular Disease

## 2016-03-03 ENCOUNTER — Ambulatory Visit (INDEPENDENT_AMBULATORY_CARE_PROVIDER_SITE_OTHER): Payer: Medicare Other | Admitting: Nurse Practitioner

## 2016-03-03 ENCOUNTER — Encounter (INDEPENDENT_AMBULATORY_CARE_PROVIDER_SITE_OTHER): Payer: Self-pay

## 2016-03-03 ENCOUNTER — Encounter: Payer: Self-pay | Admitting: Nurse Practitioner

## 2016-03-03 VITALS — BP 116/62 | HR 82 | Ht 66.0 in | Wt 102.5 lb

## 2016-03-03 DIAGNOSIS — E785 Hyperlipidemia, unspecified: Secondary | ICD-10-CM | POA: Diagnosis not present

## 2016-03-03 DIAGNOSIS — R002 Palpitations: Secondary | ICD-10-CM

## 2016-03-03 DIAGNOSIS — I739 Peripheral vascular disease, unspecified: Secondary | ICD-10-CM | POA: Diagnosis not present

## 2016-03-03 NOTE — Progress Notes (Signed)
Office Visit    Patient Name: Pamela Moon Date of Encounter: 03/03/2016  Primary Care Provider:  Nicki Reaper, NP Primary Cardiologist:  Judie Petit. Kirke Corin, MD   Chief Complaint    75 y/o ? with a h/o PAD, claudication, HTN, HL, and tob abuse who presents for f/u after recent testing.  Past Medical History    Past Medical History  Diagnosis Date  . Chicken pox   . Allergy   . Arthritis   . Depression   . Hyperlipidemia   . PAD (peripheral artery disease) (HCC)     a. 12/2014 LE arterial doppler: ABI of 0.78 on the right and 0.89 on the left, possible inflow disease on the left side. There was a focal greater than 50% stenosis in the mid right SFA with a peak velocity of 320;  b. 01/2016 ABI: R: 0.90, L: 0.90.  Marland Kitchen History of nuclear stress test     a. 12/2014: no ischemia, nl EF  . Tobacco abuse     a. ongoing, 50+ pack years  . Palpitations    Past Surgical History  Procedure Laterality Date  . Cholecystectomy    . Abdominal hysterectomy    . Cataract extraction Right 10/22/2014    Allergies  No Known Allergies  History of Present Illness    75 y/o ? with the above PMH.  She has a h/o claudication with abnl ABIs in the past with suggestion of R SFA dzs.  She was managed with ASA and pletal but subsequently stopped pletal in the summer of 2016 as she felt that it was causing her to be dizzy.  Claudication Ss have since been stable however she was recently seen in late January with complaints of palpitations.  She underwent event monitoring, which showed sinus rhythm and sinus tachycardia.  No significant arrhythmias were found.  Since her last visit, she says that she has done well.  She says that she is fairly active, walking often without significant claudication.  She has had repeat ABIs which were stable @ 0.90 bilat.  She has occasionally had palpitations but they haven't been particularly bothersome.  She has been reassured by the event monitor findings.  She denies chest  pain, dyspnea, pnd, orthopnea, n, v, dizziness, syncope, edema, weight gain, or early satiety.  She continues to smoke.  Home Medications    Prior to Admission medications   Medication Sig Start Date End Date Taking? Authorizing Provider  aspirin 81 MG tablet Take 81 mg by mouth daily.   Yes Historical Provider, MD  atorvastatin (LIPITOR) 40 MG tablet Take 1 tablet (40 mg total) by mouth daily. 01/28/16  Yes Ryan M Dunn, PA-C  cholecalciferol (VITAMIN D) 400 units TABS tablet Take 400 Units by mouth daily.   Yes Historical Provider, MD  niacin 500 MG tablet Take 500 mg by mouth at bedtime.   Yes Historical Provider, MD    Review of Systems    She sometimes notes palpitations though the burden appears to be less since monitoring.  She walks regularly w/o pain/claudication.  She denies chest pain, dyspnea, pnd, orthopnea, n, v, dizziness, syncope, edema, weight gain, or early satiety.  All other systems reviewed and are otherwise negative except as noted above.  Physical Exam    VS:  BP 116/62 mmHg  Pulse 82  Ht  (1.676 m)  Wt 102 lb 8 oz (46.494 kg)  BMI 16.55 kg/m2  SpO2 98%, BMI Body mass index is 16.55 kg/(m^2).  GEN: Well nourished, well developed, in no acute distress. HEENT: normal. Neck: Supple, no JVD, carotid bruits, or masses. Cardiac: RRR, no murmurs, rubs, or gallops. No clubbing, cyanosis, edema.  Radials/DP/PT 2+ and equal bilaterally.  Respiratory:  Respirations regular and unlabored, clear to auscultation bilaterally. GI: Soft, nontender, nondistended, BS + x 4. MS: no deformity or atrophy. Skin: warm and dry, no rash. Neuro:  Strength and sensation are intact. Psych: Normal affect.  Accessory Clinical Findings    ABI's and Event Monitor reviewed with Ms. Su Hiltoberts today  Assessment & Plan    1.  PAD/Claudication:  She has been doing reasonably well and walking daily w/o significant limitations.  Recent ABIs showed stability @ 0.90 bilat.  Cont asa and statin.   She is not willing to retry pletal as she feels that it made her dizzy in the past.  2.  Palpitations:  She recently wore an event monitor which showed sinus rhythm and sinus tach.  She says that she did note palpitations during that time.  No further therapy indicated.  3.  Tob Abuse:  Complete cessation advised.  She is not contemplating this @ this time.  She say that "if I quit, I'd kill somebody."  4.  HL:  Cont statin therapy.  LDL 96 in January.  5.  Dispo:  F/u Dr. Kirke CorinArida in 6 months or sooner if necessary.  Nicolasa Duckinghristopher Seleta Hovland, NP 03/03/2016, 2:46 PM

## 2016-03-03 NOTE — Patient Instructions (Addendum)
Medication Instructions:  Please continue your current medications  Labwork: None  Testing/Procedures: None  Follow-Up: Your physician wants you to follow-up in: 6 months w/ Dr. Arida.  You will receive a reminder letter in the mail two months in advance.  If you don't receive a letter, please call our office to schedule the follow-up appointment.  If you need a refill on your cardiac medications before your next appointment, please call your pharmacy.   

## 2016-04-21 ENCOUNTER — Encounter: Payer: Self-pay | Admitting: Internal Medicine

## 2016-04-21 ENCOUNTER — Ambulatory Visit (INDEPENDENT_AMBULATORY_CARE_PROVIDER_SITE_OTHER): Payer: Medicare Other | Admitting: Internal Medicine

## 2016-04-21 VITALS — BP 128/90 | HR 97 | Temp 97.8°F | Wt 100.0 lb

## 2016-04-21 DIAGNOSIS — K645 Perianal venous thrombosis: Secondary | ICD-10-CM | POA: Diagnosis not present

## 2016-04-21 DIAGNOSIS — H60543 Acute eczematoid otitis externa, bilateral: Secondary | ICD-10-CM

## 2016-04-21 DIAGNOSIS — M545 Low back pain, unspecified: Secondary | ICD-10-CM

## 2016-04-21 DIAGNOSIS — R319 Hematuria, unspecified: Secondary | ICD-10-CM | POA: Diagnosis not present

## 2016-04-21 DIAGNOSIS — N390 Urinary tract infection, site not specified: Secondary | ICD-10-CM | POA: Diagnosis not present

## 2016-04-21 LAB — POC URINALSYSI DIPSTICK (AUTOMATED)
Bilirubin, UA: NEGATIVE
Glucose, UA: NEGATIVE
Ketones, UA: NEGATIVE
Nitrite, UA: NEGATIVE
SPEC GRAV UA: 1.01
Urobilinogen, UA: NEGATIVE
pH, UA: 6

## 2016-04-21 MED ORDER — CIPROFLOXACIN HCL 250 MG PO TABS: 250.0000 mg | ORAL_TABLET | Freq: Two times a day (BID) | ORAL | Status: DC

## 2016-04-21 MED ORDER — HYDROCORTISONE-ACETIC ACID 1-2 % OT SOLN: 3.0000 [drp] | Freq: Three times a day (TID) | OTIC | Status: DC

## 2016-04-21 MED ORDER — HYDROCORTISONE ACE-PRAMOXINE 1-1 % RE FOAM: 1.0000 | Freq: Two times a day (BID) | RECTAL | Status: DC

## 2016-04-21 NOTE — Progress Notes (Signed)
Pre visit review using our clinic review tool, if applicable. No additional management support is needed unless otherwise documented below in the visit note. 

## 2016-04-21 NOTE — Patient Instructions (Signed)

## 2016-04-21 NOTE — Progress Notes (Signed)
Subjective:    Patient ID: Pamela Moon, female    DOB: 10/27/1941, 75 y.o.   MRN: [id number]  HPI  Pt presents to the clinic today with c/o blood in her urine. This started 1-2 months ago. It has been intermittent during that time. When it occurs, it is associated with bladder pressure and frequency. She denies dysuria, fever, chills or nausea. She sometimes has issues with constipation, relieved by a stool softener. She denies any blood in her stool. She has had a hysterectomy. She denies any bleeding from the vagina.  She also reports that both of her ears itch. This has been going on for years. She denies drainage or difficulty hearing. She has not tried anything OTC.  She also wants to know what she can take for her low back pain. She was diagnosed with arthritis many years ago. She takes Aleve as needed for it with some relief.  Review of Systems      Past Medical History  Diagnosis Date  . Chicken pox   . Allergy   . Arthritis   . Depression   . Hyperlipidemia   . PAD (peripheral artery disease) (HCC)     a. 12/2014 LE arterial doppler: ABI of 0.78 on the right and 0.89 on the left, possible inflow disease on the left side. There was a focal greater than 50% stenosis in the mid right SFA with a peak velocity of 320;  b. 01/2016 ABI: R: 0.90, L: 0.90.  Marland Kitchen History of nuclear stress test     a. 12/2014: no ischemia, nl EF  . Tobacco abuse     a. ongoing, 50+ pack years  . Palpitations     Current Outpatient Prescriptions  Medication Sig Dispense Refill  . aspirin 81 MG tablet Take 81 mg by mouth daily.    Marland Kitchen atorvastatin (LIPITOR) 40 MG tablet Take 1 tablet (40 mg total) by mouth daily. 30 tablet 3  . cholecalciferol (VITAMIN D) 400 units TABS tablet Take 400 Units by mouth daily. Reported on 04/21/2016    . niacin 500 MG tablet Take 500 mg by mouth at bedtime. Reported on 04/21/2016     No current facility-administered medications for this visit.    No Known  Allergies  Family History  Problem Relation Age of Onset  . Hyperlipidemia Mother   . Heart disease Mother   . Hypertension Mother   . Alcohol abuse Sister   . Alzheimer's disease Sister   . Heart disease Sister   . Hypertension Sister   . Alcohol abuse Brother   . Hyperlipidemia Brother   . Heart disease Brother   . Stroke Brother   . Hyperlipidemia Brother   . Cancer Neg Hx     Social History   Social History  . Marital Status: Married    Spouse Name: N/A  . Number of Children: N/A  . Years of Education: N/A   Occupational History  . Not on file.   Social History Main Topics  . Smoking status: Current Every Day Smoker -- 1.00 packs/day for 50 years    Types: Cigarettes  . Smokeless tobacco: Never Used  . Alcohol Use: No  . Drug Use: No  . Sexual Activity: No   Other Topics Concern  . Not on file   Social History Narrative     Constitutional: Denies fever, malaise, fatigue, headache or abrupt weight changes.  HEENT: Pt reports itchy ears. Denies eye pain, eye redness, ear pain, ringing in  the ears, wax buildup, runny nose, nasal congestion, bloody nose, or sore throat. Respiratory: Denies difficulty breathing, shortness of breath, cough or sputum production.   Cardiovascular: Denies chest pain, chest tightness, palpitations or swelling in the hands or feet.  Gastrointestinal: Denies abdominal pain, bloating, constipation, diarrhea or blood in the stool.  GU: Pt reports bladder pressure, frequency and blood in her urine. Denies urgency, pain with urination, burning sensation, odor or discharge. Musculoskeletal: Pt reports back pain. Denies decrease in range of motion, difficulty with gait, muscle pain or joint swelling.  Skin: Denies redness, rashes, lesions or ulcercations.    No other specific complaints in a complete review of systems (except as listed in HPI above).  Objective:   Physical Exam  BP 128/90 mmHg  Pulse 97  Temp(Src) 97.8 F (36.6 C)  (Oral)  Wt 100 lb (45.36 kg)  SpO2 97% Wt Readings from Last 3 Encounters:  04/21/16 100 lb (45.36 kg)  03/03/16 102 lb 8 oz (46.494 kg)  01/27/16 101 lb (45.813 kg)    General: Appears her stated age, in NAD. Skin: Warm, dry and intact.  HEENT: Head: normal shape and size; Ears: Dry, flaky skin noted of bilateral ear canals.   Cardiovascular: Normal rate and rhythm. S1,S2 noted.  No murmur, rubs or gallops noted.  Pulmonary/Chest: Normal effort and positive vesicular breath sounds. No respiratory distress.   Abdomen: Soft and tender to palpation over the bladder. Normal bowel sounds. No distention or masses noted. No CVA tenderness. Pelvic: Normal female anatomy. No bleeding noted from the vaginal vault. Rectal: Multiple external thrombosed external hemorrhoids noted. Normal rectal tone. No internal mass noted. Musculoskeletal: Normal flexion, extension and rotation of the spine. Tenderness to palpation over the lumbar region. No difficulty with gait.    BMET    Component Value Date/Time   NA 144 01/27/2016 1426   NA 140 11/01/2014 1411   K 5.2 01/27/2016 1426   CL 103 01/27/2016 1426   CO2 22 01/27/2016 1426   GLUCOSE 89 01/27/2016 1426   GLUCOSE 92 11/01/2014 1411   BUN 13 01/27/2016 1426   BUN 11 11/01/2014 1411   CREATININE 0.87 01/27/2016 1426   CALCIUM 10.9* 01/27/2016 1426   GFRNONAA 66 01/27/2016 1426   GFRAA 76 01/27/2016 1426    Lipid Panel     Component Value Date/Time   CHOL 203* 01/27/2016 1426   CHOL 221* 11/01/2014 1411   TRIG 107 01/27/2016 1426   HDL 86 01/27/2016 1426   HDL 78.80 11/01/2014 1411   CHOLHDL 2.4 01/27/2016 1426   CHOLHDL 3 11/01/2014 1411   VLDL 15.0 11/01/2014 1411   LDLCALC 96 01/27/2016 1426   LDLCALC 127* 11/01/2014 1411    CBC    Component Value Date/Time   WBC 8.3 11/01/2014 1411   RBC 4.36 11/01/2014 1411   HGB 13.9 11/01/2014 1411   HCT 42.5 11/01/2014 1411   PLT 241.0 11/01/2014 1411   MCV 97.6 11/01/2014 1411    MCHC 32.7 11/01/2014 1411   RDW 12.7 11/01/2014 1411    Hgb A1C No results found for: HGBA1C       Assessment & Plan:  Hematuria, Urinary Frequency and Bladder Pressure, secondary to UTI:  Urinalysis: 3+ leuks, 3+ blood Will send urine culture eRx for Cipro 250 mg BID x 5 days Push fluids  External Hemorrhoids:  eRx for Proctofoam- use as directed Continue to take stool softener Push fluids  Back pain:  Start taking Aleve daily If persist, will  obtain xray of lumbar spine  Eczema of external ear bilaterally:  eRx for Hydrocortisone-Acetic Acid solution  Make an appt for your annual exam

## 2016-04-21 NOTE — Addendum Note (Signed)
Addended by: Roena MaladyEVONTENNO, Herma Uballe Y on: 04/21/2016 05:08 PM   Modules accepted: Orders

## 2016-04-23 LAB — URINE CULTURE: Colony Count: >=100000

## 2016-04-26 ENCOUNTER — Ambulatory Visit (INDEPENDENT_AMBULATORY_CARE_PROVIDER_SITE_OTHER)
Admission: RE | Admit: 2016-04-26 | Discharge: 2016-04-26 | Disposition: A | Discharge status: Home / Self Care | Payer: Medicare Other | Source: Ambulatory Visit | Attending: Internal Medicine | Admitting: Internal Medicine

## 2016-04-26 ENCOUNTER — Ambulatory Visit (INDEPENDENT_AMBULATORY_CARE_PROVIDER_SITE_OTHER): Payer: Medicare Other | Admitting: Internal Medicine

## 2016-04-26 ENCOUNTER — Encounter: Payer: Self-pay | Admitting: Internal Medicine

## 2016-04-26 VITALS — BP 126/74 | HR 66 | Temp 97.4°F | Ht 66.5 in | Wt 100.2 lb

## 2016-04-26 DIAGNOSIS — E2839 Other primary ovarian failure: Secondary | ICD-10-CM

## 2016-04-26 DIAGNOSIS — M549 Dorsalgia, unspecified: Secondary | ICD-10-CM | POA: Diagnosis not present

## 2016-04-26 DIAGNOSIS — J302 Other seasonal allergic rhinitis: Secondary | ICD-10-CM | POA: Diagnosis not present

## 2016-04-26 DIAGNOSIS — I739 Peripheral vascular disease, unspecified: Secondary | ICD-10-CM

## 2016-04-26 DIAGNOSIS — M545 Low back pain, unspecified: Secondary | ICD-10-CM

## 2016-04-26 DIAGNOSIS — E785 Hyperlipidemia, unspecified: Secondary | ICD-10-CM

## 2016-04-26 DIAGNOSIS — F329 Major depressive disorder, single episode, unspecified: Secondary | ICD-10-CM | POA: Diagnosis not present

## 2016-04-26 DIAGNOSIS — E440 Moderate protein-calorie malnutrition: Secondary | ICD-10-CM

## 2016-04-26 DIAGNOSIS — N390 Urinary tract infection, site not specified: Secondary | ICD-10-CM | POA: Diagnosis not present

## 2016-04-26 DIAGNOSIS — F32A Depression, unspecified: Secondary | ICD-10-CM

## 2016-04-26 LAB — COMPREHENSIVE METABOLIC PANEL
ALT: 22 U/L (ref 0–35)
AST: 23 U/L (ref 0–37)
Albumin: 4.6 g/dL (ref 3.5–5.2)
Alkaline Phosphatase: 88 U/L (ref 39–117)
BILIRUBIN TOTAL: 0.5 mg/dL (ref 0.2–1.2)
BUN: 11 mg/dL (ref 6–23)
CO2: 28 meq/L (ref 19–32)
Calcium: 11.1 mg/dL — ABNORMAL HIGH (ref 8.4–10.5)
Chloride: 104 mEq/L (ref 96–112)
Creatinine, Ser: 0.92 mg/dL (ref 0.40–1.20)
GFR: 63.3 mL/min (ref >60.00)
GLUCOSE: 100 mg/dL — AB (ref 70–99)
Potassium: 4 mEq/L (ref 3.5–5.1)
Sodium: 140 mEq/L (ref 135–145)
Total Protein: 7.3 g/dL (ref 6.0–8.3)

## 2016-04-26 LAB — CBC
HCT: 42.7 % (ref 36.0–46.0)
Hemoglobin: 14.3 g/dL (ref 12.0–15.0)
MCHC: 33.4 g/dL (ref 30.0–36.0)
MCV: 95.3 fl (ref 78.0–100.0)
Platelets: 234 10*3/uL (ref 150.0–400.0)
RBC: 4.48 Mil/uL (ref 3.87–5.11)
RDW: 12.8 % (ref 11.5–15.5)
WBC: 9 10*3/uL (ref 4.0–10.5)

## 2016-04-26 LAB — VITAMIN D 25 HYDROXY (VIT D DEFICIENCY, FRACTURES): VITD: 44.25 ng/mL (ref 30.00–100.00)

## 2016-04-26 MED ORDER — MIRTAZAPINE 7.5 MG PO TABS: 7.5000 mg | ORAL_TABLET | Freq: Every day | ORAL | Status: DC

## 2016-04-26 NOTE — Assessment & Plan Note (Addendum)
Lumbar Xray today Try Aleve once daily

## 2016-04-26 NOTE — Progress Notes (Signed)
Pre visit review using our clinic review tool, if applicable. No additional management support is needed unless otherwise documented below in the visit note. 

## 2016-04-26 NOTE — Assessment & Plan Note (Signed)
Not currently on medication but would like to start Start Remeron

## 2016-04-26 NOTE — Assessment & Plan Note (Signed)
On Liptior and ASA Will continue to monitor

## 2016-04-26 NOTE — Assessment & Plan Note (Signed)
Cannot gain weight Vegetarian Start Remeron

## 2016-04-26 NOTE — Assessment & Plan Note (Signed)
Takes Lipitor and ASA Followed by cardiology

## 2016-04-26 NOTE — Assessment & Plan Note (Signed)
Controlled on OTC meds PRN

## 2016-04-26 NOTE — Patient Instructions (Signed)

## 2016-04-26 NOTE — Progress Notes (Signed)
HPI:  Pt presents to the clinic today for her Medicare Wellness exam. She is also due for follow up of chronic conditions.  Seasonal Allergies: Worse in the spring. Takes OTC allergy pills as needed.  Arthritis: Mainly in the lower back. The pain has been increasing and is now constant. She can't remain in 1 position for a long time. It is worse when standing. She saw a chiropractor (5 yrs ago), who told her it was degenerative arthritis. She denies numbness or tingling of legs. She denies radiation of pain to the sides or buttocks. She occasionally takes OTC pain medication.  Depression: Not currently taking any medicine. Would like to try something but is worried the medicine will cause constipation as it did in the past.   HLD with PAD: She takes Lipitor and ASA. She is being ollowed by Dr. Kirke Corin.   Past Medical History  Diagnosis Date  . Chicken pox   . Allergy   . Arthritis   . Depression   . Hyperlipidemia   . PAD (peripheral artery disease) (HCC)     a. 12/2014 LE arterial doppler: ABI of 0.78 on the right and 0.89 on the left, possible inflow disease on the left side. There was a focal greater than 50% stenosis in the mid right SFA with a peak velocity of 320;  b. 01/2016 ABI: R: 0.90, L: 0.90.  Marland Kitchen History of nuclear stress test     a. 12/2014: no ischemia, nl EF  . Tobacco abuse     a. ongoing, 50+ pack years  . Palpitations     Current Outpatient Prescriptions  Medication Sig Dispense Refill  . acetic acid-hydrocortisone (VOSOL-HC) otic solution Place 3 drops into both ears 3 (three) times daily. 60 mL 0  . aspirin 81 MG tablet Take 81 mg by mouth daily.    Marland Kitchen atorvastatin (LIPITOR) 40 MG tablet Take 1 tablet (40 mg total) by mouth daily. 30 tablet 3  . cholecalciferol (VITAMIN D) 400 units TABS tablet Take 400 Units by mouth daily. Reported on 04/21/2016    . hydrocortisone-pramoxine (PROCTOFOAM-HC) rectal foam Place 1 applicator rectally 2 (two) times daily. 10 g 0  . niacin  500 MG tablet Take 500 mg by mouth at bedtime. Reported on 04/21/2016     No current facility-administered medications for this visit.    No Known Allergies  Family History  Problem Relation Age of Onset  . Hyperlipidemia Mother   . Heart disease Mother   . Hypertension Mother   . Alcohol abuse Sister   . Alzheimer's disease Sister   . Heart disease Sister   . Hypertension Sister   . Alcohol abuse Brother   . Hyperlipidemia Brother   . Heart disease Brother   . Stroke Brother   . Hyperlipidemia Brother   . Cancer Neg Hx     Social History   Social History  . Marital Status: Married    Spouse Name: N/A  . Number of Children: N/A  . Years of Education: N/A   Occupational History  . Not on file.   Social History Main Topics  . Smoking status: Current Every Day Smoker -- 1.00 packs/day for 50 years    Types: Cigarettes  . Smokeless tobacco: Never Used  . Alcohol Use: No  . Drug Use: No  . Sexual Activity: No   Other Topics Concern  . Not on file   Social History Narrative    Hospitiliaztions: None in the past 6 mo  Health Maintenance:    Flu: Unknown  Tetanus: unknown, >10 yrs  Pneumovax: No  Prevnar: Yes, but can't remember date  Zostavax: No  Mammogram: Never  Pap Smear: Had a hysterectomy  Bone Density: 09/2005  Colon Screening: Never  Eye Doctor: sees eye surgeon for cataracts  Dental Exam: has dentures, does not go to dentist   Providers:   PCP: Nicki Reaper, NP  Cardiologist: Dr. Lorine Bears    I have personally reviewed and have noted:  1. The patient's medical and social history 2. Their use of alcohol, tobacco or illicit drugs 3. Their current medications and supplements 4. The patient's functional ability including ADL's, fall risks, home safety  risks and hearing or visual impairment. 5. Diet and physical activities 6. Evidence for depression or mood disorder  Subjective:   Review of Systems:   Constitutional: Denies fever,  malaise, fatigue, headache or abrupt weight changes.  Respiratory: Denies difficulty breathing, shortness of breath, cough or sputum production.   Cardiovascular: Denies chest pain, chest tightness, palpitations or swelling in the hands or feet.  GU: Denies urgency, frequency, pain with urination, burning sensation, blood in urine, odor or discharge. Musculoskeletal: Pt reports low back pain. Denies decrease in range of motion and swelling.  Skin: Denies redness, rashes, lesions or ulcercations.  Neurological: Denies dizziness, difficulty with memory, difficulty with speech or problems with balance and coordination.  Psych: Positive for depression. Denies anxiety SI/HI.  No other specific complaints in a complete review of systems (except as listed in HPI above).  Objective:  PE:   BP 126/74 mmHg  Pulse 66  Temp(Src) 97.4 F (36.3 C) (Oral)  Ht 5' 6.5" (1.689 m)  Wt 100 lb 4 oz (45.473 kg)  BMI 15.94 kg/m2  SpO2 98% Wt Readings from Last 3 Encounters:  04/26/16 100 lb 4 oz (45.473 kg)  04/21/16 100 lb (45.36 kg)  03/03/16 102 lb 8 oz (46.494 kg)    General: Appears her stated age, underweight, in NAD. Skin: Warm, dry and intact.  Musculoskeletal: Normal flexion, extension and rotation of the spine. No TTP over spine. No difficulty with gait. Neurological: Alert and oriented. Cranial nerves II-XII grossly intact. Coordination normal.  Psychiatric: Mood and affect normal. Behavior is normal. Judgment and thought content normal.    BMET    Component Value Date/Time   NA 144 01/27/2016 1426   NA 140 11/01/2014 1411   K 5.2 01/27/2016 1426   CL 103 01/27/2016 1426   CO2 22 01/27/2016 1426   GLUCOSE 89 01/27/2016 1426   GLUCOSE 92 11/01/2014 1411   BUN 13 01/27/2016 1426   BUN 11 11/01/2014 1411   CREATININE 0.87 01/27/2016 1426   CALCIUM 10.9* 01/27/2016 1426   GFRNONAA 66 01/27/2016 1426   GFRAA 76 01/27/2016 1426    Lipid Panel     Component Value Date/Time    CHOL 203* 01/27/2016 1426   CHOL 221* 11/01/2014 1411   TRIG 107 01/27/2016 1426   HDL 86 01/27/2016 1426   HDL 78.80 11/01/2014 1411   CHOLHDL 2.4 01/27/2016 1426   CHOLHDL 3 11/01/2014 1411   VLDL 15.0 11/01/2014 1411   LDLCALC 96 01/27/2016 1426   LDLCALC 127* 11/01/2014 1411    CBC    Component Value Date/Time   WBC 8.3 11/01/2014 1411   RBC 4.36 11/01/2014 1411   HGB 13.9 11/01/2014 1411   HCT 42.5 11/01/2014 1411   PLT 241.0 11/01/2014 1411   MCV 97.6 11/01/2014 1411  MCHC 32.7 11/01/2014 1411   RDW 12.7 11/01/2014 1411    Hgb A1C No results found for: HGBA1C    Assessment and Plan:   Medicare Annual Wellness Visit:  Diet: Vegetarian Physical activity: Walks 1 mile daily Depression/mood screen: Positive for depression. Negative for SI/HI Hearing: Intact to whispered voice Visual acuity: Grossly normal, performs annual eye exam  ADLs: Capable Fall risk: None Home safety: Good  Cognitive evaluation: Intact to orientation, naming, recall and repetition EOL planning: No adv directives, full code/ I agree  Preventative Medicine: She declines all vaccines, colon screening, pap smear, mammogram and bone density exam. Encouraged her to consume a balanced diet and start an exercise regimen. Advised her to see an eye doctor annually   Next appointment: 1 year

## 2016-04-26 NOTE — Addendum Note (Signed)
Addended by: Baldomero LamyHAVERS, Doniven Vanpatten C on: 04/26/2016 04:17 PM   Modules accepted: Kipp BroodSmartSet

## 2016-04-28 LAB — POC URINALSYSI DIPSTICK (AUTOMATED)
Bilirubin, UA: NEGATIVE
Glucose, UA: NEGATIVE
Ketones, UA: NEGATIVE
Leukocytes, UA: NEGATIVE
Nitrite, UA: NEGATIVE
PH UA: 6.5
PROTEIN UA: NEGATIVE
RBC UA: NEGATIVE
SPEC GRAV UA: 1.01
UROBILINOGEN UA: NEGATIVE

## 2016-04-28 NOTE — Addendum Note (Signed)
Addended by: Roena MaladyEVONTENNO, Azariel Banik Y on: 04/28/2016 05:02 PM   Modules accepted: Orders

## 2016-05-24 ENCOUNTER — Other Ambulatory Visit: Payer: Self-pay | Admitting: Physician Assistant

## 2016-08-31 ENCOUNTER — Ambulatory Visit (INDEPENDENT_AMBULATORY_CARE_PROVIDER_SITE_OTHER): Payer: Medicare Other | Admitting: Internal Medicine

## 2016-08-31 ENCOUNTER — Telehealth: Payer: Self-pay | Admitting: Internal Medicine

## 2016-08-31 DIAGNOSIS — Z23 Encounter for immunization: Secondary | ICD-10-CM | POA: Diagnosis not present

## 2016-08-31 NOTE — Telephone Encounter (Signed)
[facility] due to personality conflicts. Is it ok to transfer?

## 2016-08-31 NOTE — Telephone Encounter (Signed)
Fine with me

## 2016-09-01 NOTE — Progress Notes (Signed)
Flu shot provided at nurse visit, chart reviewed

## 2016-09-05 NOTE — Telephone Encounter (Signed)
Okay with me 

## 2016-09-18 ENCOUNTER — Other Ambulatory Visit: Payer: Self-pay | Admitting: Cardiovascular Disease

## 2017-01-17 ENCOUNTER — Other Ambulatory Visit: Payer: Self-pay | Admitting: Nurse Practitioner

## 2017-01-20 ENCOUNTER — Encounter: Payer: Self-pay | Admitting: Cardiovascular Disease

## 2017-01-20 ENCOUNTER — Ambulatory Visit (INDEPENDENT_AMBULATORY_CARE_PROVIDER_SITE_OTHER): Payer: Medicare Other | Admitting: Cardiovascular Disease

## 2017-01-20 VITALS — BP 140/78 | HR 67

## 2017-01-20 DIAGNOSIS — Z720 Tobacco use: Secondary | ICD-10-CM

## 2017-01-20 DIAGNOSIS — E785 Hyperlipidemia, unspecified: Secondary | ICD-10-CM

## 2017-01-20 DIAGNOSIS — R002 Palpitations: Secondary | ICD-10-CM

## 2017-01-20 DIAGNOSIS — I739 Peripheral vascular disease, unspecified: Secondary | ICD-10-CM

## 2017-01-20 NOTE — Patient Instructions (Signed)
Medication Instructions:  Your physician recommends that you continue on your current medications as directed. Please refer to the Current Medication list given to you today.   Labwork: Lipid and liver profile. Nothing to eat or drink after midnight the evening before your labs.  Testing/Procedures: None  Follow-Up: Your physician wants you to follow-up in: six months with Dr. Kirke CorinArida.  You will receive a reminder letter in the mail two months in advance. If you don't receive a letter, please call our office to schedule the follow-up appointment.   Any Other Special Instructions Will Be Listed Below (If Applicable).     If you need a refill on your cardiac medications before your next appointment, please call your pharmacy.

## 2017-01-20 NOTE — Progress Notes (Signed)
Cardiology Office Note   Date:  01/20/2017   ID:  Morena, Mckissack 11/07/1941, MRN [id number]  PCP:  Morrie Sheldon, NP  Cardiologist:   Lorine Bears, MD   Chief Complaint  Patient presents with  . other     OD 6 month f/u. Pt c/o sob and chest pain at times. Reviewed meds with pt verbally.      History of Present Illness: BETHANNE MULE is a 76 y.o. female who presents for a follow-up visit regarding  peripheral arterial disease. She has a 50 year smoking history, 1 pack per day who continues to smoke, anorexia, and strong family history of coronary artery disease. Nuclear stress test in January of 2016 showed no evidence of ischemia with normal ejection fraction.  She is known to have peripheral arterial disease with atypical claudication and no previous critical limb ischemia. Noninvasive vascular evaluation in 2016 showed mildly reduced ABI.  There was possible inflow disease on the left side. There was a focal greater than 50% stenosis in the mid right SFA with a peak velocity of 320.  She did not tolerate cilostazol due to dizziness. She has been doing reasonably well without chest pain or significant dyspnea. She was seen last year by [id number] for palpitations. She had outpatient telemetry done which showed sinus rhythm with sinus tachycardia and no significant arrhythmia. Repeat ABI was stable at 0.9 bilaterally.   Past Medical History:  Diagnosis Date  . Allergy   . Arthritis   . Chicken pox   . Depression   . History of nuclear stress test    a. 12/2014: no ischemia, nl EF  . Hyperlipidemia   . PAD (peripheral artery disease) (HCC)    a. 12/2014 LE arterial doppler: ABI of 0.78 on the right and 0.89 on the left, possible inflow disease on the left side. There was a focal greater than 50% stenosis in the mid right SFA with a peak velocity of 320;  b. 01/2016 ABI: R: 0.90, L: 0.90.  Marland Kitchen Palpitations   . Tobacco abuse    a. ongoing, 50+ pack years    Past  Surgical History:  Procedure Laterality Date  . ABDOMINAL HYSTERECTOMY    . CATARACT EXTRACTION Right 10/22/2014  . CHOLECYSTECTOMY       Current Outpatient Prescriptions  Medication Sig Dispense Refill  . aspirin 81 MG tablet Take 81 mg by mouth daily.    Marland Kitchen atorvastatin (LIPITOR) 40 MG tablet TAKE 1 TABLET BY MOUTH EVERY DAY 30 tablet 0   No current facility-administered medications for this visit.     Allergies:   Patient has no known allergies.    Social History:  The patient  reports that she has been smoking Cigarettes.  She has a 50.00 pack-year smoking history. She has never used smokeless tobacco. She reports that she does not drink alcohol or use drugs.   Family History:  The patient's family history includes Alcohol abuse in her brother and sister; Alzheimer's disease in her sister; Heart disease in her brother, mother, and sister; Hyperlipidemia in her brother, brother, and mother; Hypertension in her mother and sister; Stroke in her brother.    ROS:  Please see the history of present illness.   Otherwise, review of systems are positive for none.   All other systems are reviewed and negative.    PHYSICAL EXAM: VS:  BP 140/78 (BP Location: Left Arm, Patient Position: Sitting, Cuff Size: Normal)   Pulse 67  ,  BMI There is no height or weight on file to calculate BMI. GEN: Well nourished, well developed, in no acute distress  HEENT: normal  Neck: no JVD, carotid bruits, or masses Cardiac: RRR; no murmurs, rubs, or gallops,no edema  Respiratory:  clear to auscultation bilaterally, normal work of breathing GI: soft, nontender, nondistended, + BS MS: no deformity or atrophy  Skin: warm and dry, no rash Neuro:  Strength and sensation are intact Psych: euthymic mood, full affect   EKG:  EKG is ordered today. The ekg ordered today demonstrates normal sinus rhythm with left axis deviation.   Recent Labs: 04/26/2016: ALT 22; BUN 11; Creatinine, Ser 0.92; Hemoglobin 14.3;  Platelets 234.0; Potassium 4.0; Sodium 140    Lipid Panel    Component Value Date/Time   CHOL 203 (H) 01/27/2016 1426   TRIG 107 01/27/2016 1426   HDL 86 01/27/2016 1426   CHOLHDL 2.4 01/27/2016 1426   CHOLHDL 3 11/01/2014 1411   VLDL 15.0 11/01/2014 1411   LDLCALC 96 01/27/2016 1426      Wt Readings from Last 3 Encounters:  04/26/16 100 lb 4 oz (45.5 kg)  04/21/16 100 lb (45.4 kg)  03/03/16 102 lb 8 oz (46.5 kg)        No flowsheet data found.    ASSESSMENT AND PLAN:  1.  Peripheral arterial disease: Atypical leg claudication with only mildly reduced ABI. Stable symptoms. Continue medical therapy.  2. Hyperlipidemia: Continue treatment with atorvastatin. I requested lipid and liver profile.  3. Tobacco use: She is not interested in smoking cessation.   Disposition:   FU with me in 6 months  Signed,  Lorine BearsMuhammad Eugine Bubb, MD  01/20/2017 4:50 PM    [facility]

## 2017-02-22 ENCOUNTER — Telehealth: Payer: Self-pay | Admitting: Cardiovascular Disease

## 2017-02-22 NOTE — Telephone Encounter (Signed)
Attempted to contact patient regarding need for labs. No answer, no voice mail.

## 2017-04-01 ENCOUNTER — Encounter (INDEPENDENT_AMBULATORY_CARE_PROVIDER_SITE_OTHER): Payer: Self-pay

## 2017-04-01 ENCOUNTER — Ambulatory Visit (INDEPENDENT_AMBULATORY_CARE_PROVIDER_SITE_OTHER): Payer: Medicare Other | Admitting: Family Medicine

## 2017-04-01 ENCOUNTER — Encounter: Payer: Self-pay | Admitting: Family Medicine

## 2017-04-01 VITALS — BP 110/70 | HR 81 | Temp 97.3°F | Wt 100.0 lb

## 2017-04-01 DIAGNOSIS — L309 Dermatitis, unspecified: Secondary | ICD-10-CM | POA: Diagnosis not present

## 2017-04-01 MED ORDER — DESONIDE 0.05 % EX CREA: TOPICAL_CREAM | Freq: Two times a day (BID) | CUTANEOUS | 0 refills | Status: DC

## 2017-04-01 NOTE — Progress Notes (Signed)
   BP 110/70 (BP Location: Left Arm, Patient Position: Sitting, Cuff Size: Normal)   Pulse 81   Temp 97.3 F (36.3 C) (Oral)   Wt 100 lb (45.4 kg)   SpO2 97%   BMI 15.90 kg/m    CC: "I have ear infections or I don't know what" Subjective:    Patient ID: Pamela Moon, female    DOB: [birth year], 76 y.o.   MRN: [id number]  HPI: Pamela Moon is a 76 y.o. female presenting on 04/01/2017 for Ear Itching (Bilateral itching for awhile.)   Ongoing for months of bilateral ear itching, scaling, some draining from ear. Some scaling of skin behind ear as well.  No ear pain, headaches, congestion, cough, tooth pain. Denies hearing changes. No recent swimming. No recent air travel. No skin rashes.  Current smoker 1 ppd.   No new lotions, detergents, soaps or shampoos  No new foods or medicines.  She does use hair spray around ears.  She has used peroxide to ears.   Some itchy erythematous rash at groin. No vag discharge.   Relevant past medical, surgical, family and social history reviewed and updated as indicated. Interim medical history since our last visit reviewed. Allergies and medications reviewed and updated. Outpatient Medications Prior to Visit  Medication Sig Dispense Refill  . aspirin 81 MG tablet Take 81 mg by mouth daily.    Marland Kitchen atorvastatin (LIPITOR) 40 MG tablet TAKE 1 TABLET BY MOUTH EVERY DAY 30 tablet 0   No facility-administered medications prior to visit.      Per HPI unless specifically indicated in ROS section below Review of Systems     Objective:    BP 110/70 (BP Location: Left Arm, Patient Position: Sitting, Cuff Size: Normal)   Pulse 81   Temp 97.3 F (36.3 C) (Oral)   Wt 100 lb (45.4 kg)   SpO2 97%   BMI 15.90 kg/m   Wt Readings from Last 3 Encounters:  04/01/17 100 lb (45.4 kg)  04/26/16 100 lb 4 oz (45.5 kg)  04/21/16 100 lb (45.4 kg)    Physical Exam  Constitutional: She appears well-developed and well-nourished. No distress.  HENT:    Right Ear: Hearing, tympanic membrane and ear canal normal.  Left Ear: Hearing, tympanic membrane and ear canal normal.  Erythematous edematous pruritic scaly rash to external canal and pinna of bilateral ears, also affecting R posterior ear TMs clear, inner canals clear  Nursing note and vitals reviewed.     Assessment & Plan:   Problem List Items Addressed This Visit    Dermatitis of external ear - Primary    Anticipate ear dermatitis. rec stop hair spray. Start desonide BID x 2 wks as well as mineral oil. Update if not improving with treatment.           Follow up plan: No Follow-up on file.  Eustaquio Boyden, MD

## 2017-04-01 NOTE — Patient Instructions (Addendum)
I think you have ear dermatitis Treat with steroid cream sent to pharmacy.  Also start using mineral oil drops into ears.  Let us know if no better with this.  For groin rash, try lotrimin over the counter and let us know if not improved with this.

## 2017-04-01 NOTE — Assessment & Plan Note (Signed)
Anticipate ear dermatitis. rec stop hair spray. Start desonide BID x 2 wks as well as mineral oil. Update if not improving with treatment.

## 2017-07-15 ENCOUNTER — Other Ambulatory Visit: Payer: Self-pay

## 2017-08-10 ENCOUNTER — Other Ambulatory Visit: Payer: Self-pay | Admitting: Primary Care

## 2017-08-10 DIAGNOSIS — E785 Hyperlipidemia, unspecified: Secondary | ICD-10-CM

## 2017-08-10 DIAGNOSIS — E559 Vitamin D deficiency, unspecified: Secondary | ICD-10-CM

## 2017-08-11 ENCOUNTER — Ambulatory Visit: Payer: Medicare Other

## 2017-08-11 ENCOUNTER — Other Ambulatory Visit (INDEPENDENT_AMBULATORY_CARE_PROVIDER_SITE_OTHER): Payer: Medicare Other

## 2017-08-11 DIAGNOSIS — E785 Hyperlipidemia, unspecified: Secondary | ICD-10-CM | POA: Diagnosis not present

## 2017-08-11 DIAGNOSIS — E559 Vitamin D deficiency, unspecified: Secondary | ICD-10-CM

## 2017-08-11 LAB — COMPREHENSIVE METABOLIC PANEL
ALK PHOS: 85 U/L (ref 39–117)
ALT: 16 U/L (ref 0–35)
AST: 18 U/L (ref 0–37)
Albumin: 4 g/dL (ref 3.5–5.2)
BUN: 12 mg/dL (ref 6–23)
CHLORIDE: 102 meq/L (ref 96–112)
CO2: 32 meq/L (ref 19–32)
Calcium: 10.6 mg/dL — ABNORMAL HIGH (ref 8.4–10.5)
Creatinine, Ser: 0.87 mg/dL (ref 0.40–1.20)
GFR: 67.28 mL/min (ref >60.00)
GLUCOSE: 116 mg/dL — AB (ref 70–99)
POTASSIUM: 5 meq/L (ref 3.5–5.1)
SODIUM: 137 meq/L (ref 135–145)
TOTAL PROTEIN: 6.3 g/dL (ref 6.0–8.3)
Total Bilirubin: 0.4 mg/dL (ref 0.2–1.2)

## 2017-08-11 LAB — LIPID PANEL
CHOL/HDL RATIO: 2
Cholesterol: 173 mg/dL (ref 0–200)
HDL: 76.8 mg/dL (ref >39.00)
LDL CALC: 82 mg/dL (ref 0–99)
NONHDL: 96.24
Triglycerides: 71 mg/dL (ref 0.0–149.0)
VLDL: 14.2 mg/dL (ref 0.0–40.0)

## 2017-08-12 LAB — VITAMIN D 25 HYDROXY (VIT D DEFICIENCY, FRACTURES): VITD: 30.41 ng/mL (ref 30.00–100.00)

## 2017-08-17 ENCOUNTER — Ambulatory Visit (INDEPENDENT_AMBULATORY_CARE_PROVIDER_SITE_OTHER): Payer: Medicare Other | Admitting: Primary Care

## 2017-08-17 ENCOUNTER — Encounter: Payer: Self-pay | Admitting: Primary Care

## 2017-08-17 VITALS — BP 122/74 | HR 88 | Temp 97.6°F | Ht 66.5 in | Wt 100.4 lb

## 2017-08-17 DIAGNOSIS — R739 Hyperglycemia, unspecified: Secondary | ICD-10-CM

## 2017-08-17 DIAGNOSIS — F331 Major depressive disorder, recurrent, moderate: Secondary | ICD-10-CM | POA: Diagnosis not present

## 2017-08-17 DIAGNOSIS — E785 Hyperlipidemia, unspecified: Secondary | ICD-10-CM

## 2017-08-17 MED ORDER — SERTRALINE HCL 25 MG PO TABS: 25.0000 mg | ORAL_TABLET | Freq: Every day | ORAL | 0 refills | Status: DC

## 2017-08-17 NOTE — Assessment & Plan Note (Signed)
Fasting blood sugar of 116 on recent labs. Check A1C today.

## 2017-08-17 NOTE — Progress Notes (Signed)
Subjective:    Patient ID: Pamela Moon, female    DOB: 09/19/1941, 76 y.o.   MRN: [id number]  HPI  Pamela Moon is a 76 year old female who presents today to transfer care from another provider in the office.  1) Hyperlipidemia: Diagnosed several years ago. Currently prescribed atorvastatin 40 mg but has not taken in three months. Recent cholesterol panel with TC of 173, Trigs of 71, and LDL of 82.   2) Dermatitis: Located inside the ears. Currently managed on desonide 0.05% cream with improvement but causes buildup in the ears.   3) Depression: Intermittent. Previously managed on mirtazapine for which she took for a few months. 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 She experiences symptoms of aggravation, sadness, fatigue. PHQ 9 score of 14 today. Denies SI/HI.   Review of Systems  Constitutional: Positive for fatigue.  Respiratory: Negative for shortness of breath.   Cardiovascular: Negative for chest pain.  Neurological: Negative for headaches.  Psychiatric/Behavioral: Positive for sleep disturbance. Negative for suicidal ideas.       See HPI       Past Medical History:  Diagnosis Date  . Allergy   . Arthritis   . Chicken pox   . Depression   . History of nuclear stress test    a. 12/2014: no ischemia, nl EF  . Hyperlipidemia   . PAD (peripheral artery disease) (HCC)    a. 12/2014 LE arterial doppler: ABI of 0.78 on the right and 0.89 on the left, possible inflow disease on the left side. There was a focal greater than 50% stenosis in the mid right SFA with a peak velocity of 320;  b. 01/2016 ABI: R: 0.90, L: 0.90.  Marland Kitchen Palpitations   . Tobacco abuse    a. ongoing, 50+ pack years     Social History   Social History  . Marital status: Married    Spouse name: N/A  . Number of children: N/A  . Years of education: N/A   Occupational History  . Not on file.   Social History Main Topics  . Smoking status: Current Every Day  Smoker    Packs/day: 1.00    Years: 50.00    Types: Cigarettes  . Smokeless tobacco: Never Used  . Alcohol use No  . Drug use: No  . Sexual activity: No   Other Topics Concern  . Not on file   Social History Narrative  . No narrative on file    Past Surgical History:  Procedure Laterality Date  . ABDOMINAL HYSTERECTOMY    . CATARACT EXTRACTION Right 10/22/2014  . CHOLECYSTECTOMY      Family History  Problem Relation Age of Onset  . Hyperlipidemia Mother   . Heart disease Mother   . Hypertension Mother   . Alcohol abuse Sister   . Alzheimer's disease Sister   . Heart disease Sister   . Hypertension Sister   . Alcohol abuse Brother   . Hyperlipidemia Brother   . Heart disease Brother   . Stroke Brother   . Hyperlipidemia Brother   . Cancer Neg Hx     No Known Allergies  Current Outpatient Prescriptions on File Prior to Visit  Medication Sig Dispense Refill  . atorvastatin (LIPITOR) 40 MG tablet TAKE 1 TABLET BY MOUTH EVERY DAY 30 tablet 0  . desonide (DESOWEN) 0.05 % cream Apply topically 2 (two) times daily. Apply to AA for no more than 2  weeks at a time. 30 g 0  . aspirin 81 MG tablet Take 81 mg by mouth daily.     No current facility-administered medications on file prior to visit.     BP 122/74   Pulse 88   Temp 97.6 F (36.4 C) (Oral)   Ht 5' 6.5" (1.689 m)   Wt 100 lb 6.4 oz (45.5 kg)   SpO2 98%   BMI 15.96 kg/m    Objective:   Physical Exam  Constitutional: She appears well-nourished.  Neck: Neck supple.  Cardiovascular: Normal rate and regular rhythm.   Pulmonary/Chest: Effort normal and breath sounds normal.  Skin: Skin is warm and dry.  Psychiatric: She has a normal mood and affect.          Assessment & Plan:

## 2017-08-17 NOTE — Assessment & Plan Note (Signed)
Recent lipid panel unremarkable, off of atorvastatin for the past three months. Will continue to monitor lipids off of atorvastatin. Repeat in 3 months.

## 2017-08-17 NOTE — Patient Instructions (Signed)
Start sertraline (Zoloft) 25 mg tablets for depression. Start by taking 1/2 tablet daily for 8 days then advance to 1 full tablet thereafter.   Schedule a follow up visit with me in 6 weeks for re-evaluation of depression.   It was a pleasure meeting you!

## 2017-08-17 NOTE — Assessment & Plan Note (Signed)
Suspect caregiver strain.  PHQ 9 score of 14 today.  Discussed options and she'd like to trial medication.  Rx for Zoloft 25 mg tablets to pharmacy. Patient is to take 1/2 tablet daily for 8 days, then advance to 1 full tablet thereafter. We discussed possible side effects of headache, GI upset, drowsiness, and SI/HI. If thoughts of SI/HI develop, we discussed to present to the emergency immediately. Patient verbalized understanding.   Follow up in 6 weeks for re-evaluation.

## 2017-08-18 LAB — HEMOGLOBIN A1C: HEMOGLOBIN A1C: 5.9 % (ref 4.6–6.5)

## 2017-08-19 ENCOUNTER — Encounter: Payer: Self-pay | Admitting: *Deleted

## 2017-09-02 DIAGNOSIS — H26491 Other secondary cataract, right eye: Secondary | ICD-10-CM | POA: Diagnosis not present

## 2017-09-28 ENCOUNTER — Ambulatory Visit (INDEPENDENT_AMBULATORY_CARE_PROVIDER_SITE_OTHER): Payer: Medicare Other | Admitting: Primary Care

## 2017-09-28 ENCOUNTER — Encounter: Payer: Self-pay | Admitting: Primary Care

## 2017-09-28 VITALS — BP 118/66 | HR 65 | Temp 98.5°F | Ht 67.0 in | Wt 98.8 lb

## 2017-09-28 DIAGNOSIS — F330 Major depressive disorder, recurrent, mild: Secondary | ICD-10-CM | POA: Diagnosis not present

## 2017-09-28 DIAGNOSIS — L309 Dermatitis, unspecified: Secondary | ICD-10-CM

## 2017-09-28 MED ORDER — SERTRALINE HCL 50 MG PO TABS: ORAL_TABLET | ORAL | 0 refills | Status: DC

## 2017-09-28 MED ORDER — DESONIDE 0.05 % EX CREA: TOPICAL_CREAM | Freq: Two times a day (BID) | CUTANEOUS | 0 refills | Status: AC

## 2017-09-28 NOTE — Assessment & Plan Note (Signed)
Improved with desonide. No obvious infection or cerumen impaction noted today. Will have her try Claritin daily for potential effusion.

## 2017-09-28 NOTE — Patient Instructions (Signed)
We've increased your sertraline (Zoloft) medication for depression from 25 mg to 50 mg. You may take two of the 25 mg tablets until your current bottle is empty.   Only take one of the 50 mg tablets of sertraline when you pick this up.  Try loratadine (Claritin) once daily for fluid in the ears. This will help to dry this up. You may purchase this over the counter.  It was a pleasure to see you today!

## 2017-09-28 NOTE — Assessment & Plan Note (Signed)
Seems to be improved on low dose Zoloft but do agree that she would benefit from an increased dose. Will increase dose to 50 mg, she will update in a few weeks.

## 2017-09-28 NOTE — Progress Notes (Signed)
Subjective:    Patient ID: Pamela Moon, female    DOB: [birth year], 76 y.o.   MRN: [id number]  HPI  Pamela Moon is a 76 year old female who presents today for follow up of depression.   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 She was initiated on Zoloft 25 mg during her last visit, PHQ 9 score of 14 that day.  Since her last visit she's feeling improved. She's noticed that she's more patient and has less sad feelings. She continues to feel fatigued and aggravated. She think she would benefit from a larger dose.   2) Ear Eczema: Located to bilateral ear canals. She using desonide cream which helps with itching, but has noticed that the cream will dry up and block her canals. She think she may have an ear infection today.   Review of Systems  HENT: Negative for ear pain.        Ear "drainage"  Skin:       Ear eczema  Psychiatric/Behavioral: Negative for sleep disturbance and suicidal ideas.       See HPI       Past Medical History:  Diagnosis Date  . Allergy   . Arthritis   . Chicken pox   . Depression   . History of nuclear stress test    a. 12/2014: no ischemia, nl EF  . Hyperlipidemia   . PAD (peripheral artery disease) (HCC)    a. 12/2014 LE arterial doppler: ABI of 0.78 on the right and 0.89 on the left, possible inflow disease on the left side. There was a focal greater than 50% stenosis in the mid right SFA with a peak velocity of 320;  b. 01/2016 ABI: R: 0.90, L: 0.90.  Marland Kitchen Palpitations   . Tobacco abuse    a. ongoing, 50+ pack years     Social History   Social History  . Marital status: Married    Spouse name: N/A  . Number of children: N/A  . Years of education: N/A   Occupational History  . Not on file.   Social History Main Topics  . Smoking status: Current Every Day Smoker    Packs/day: 1.00    Years: 50.00    Types:  Cigarettes  . Smokeless tobacco: Never Used  . Alcohol use No  . Drug use: No  . Sexual activity: No   Other Topics Concern  . Not on file   Social History Narrative  . No narrative on file    Past Surgical History:  Procedure Laterality Date  . ABDOMINAL HYSTERECTOMY    . CATARACT EXTRACTION Right 10/22/2014  . CHOLECYSTECTOMY      Family History  Problem Relation Age of Onset  . Hyperlipidemia Mother   . Heart disease Mother   . Hypertension Mother   . Alcohol abuse Sister   . Alzheimer's disease Sister   . Heart disease Sister   . Hypertension Sister   . Alcohol abuse Brother   . Hyperlipidemia Brother   . Heart disease Brother   . Stroke Brother   . Hyperlipidemia Brother   . Cancer Neg Hx     No Known Allergies  Current Outpatient Prescriptions on File Prior to Visit  Medication Sig Dispense Refill  . aspirin 81 MG tablet Take 81 mg by mouth daily.    Marland Kitchen atorvastatin (LIPITOR) 40 MG tablet TAKE  1 TABLET BY MOUTH EVERY DAY 30 tablet 0   No current facility-administered medications on file prior to visit.     BP 118/66   Pulse 65   Temp 98.5 F (36.9 C) (Oral)   Ht  (1.702 m)   Wt 98 lb 12.8 oz (44.8 kg)   SpO2 98%   BMI 15.47 kg/m    Objective:   Physical Exam  Constitutional: She appears well-nourished.  HENT:  Right Ear: Tympanic membrane is not erythematous.  Left Ear: Tympanic membrane is not erythematous.  No obvious infection to TM's or canals.  Cardiovascular: Normal rate and regular rhythm.   Pulmonary/Chest: Effort normal and breath sounds normal.  Skin:  Dry skin noted to bilateral canals  Psychiatric: She has a normal mood and affect.          Assessment & Plan:

## 2017-10-21 ENCOUNTER — Telehealth: Payer: Self-pay | Admitting: Primary Care

## 2017-10-21 NOTE — Telephone Encounter (Signed)
Message left for patient to return my call.  

## 2017-10-21 NOTE — Telephone Encounter (Addendum)
-----   Message from Doreene NestKatherine K Kennah Hehr, NP sent at 09/28/2017  4:16 PM EDT ----- Regarding: Depression How's she doing since we increased her sertraline (Zoloft) from 25 mg to 50 mg? Any improvement in depression?

## 2017-10-25 NOTE — Telephone Encounter (Signed)
Spoken to patient and she stated that she is doing much better with the increased to 50 mg

## 2017-10-26 NOTE — Telephone Encounter (Signed)
Noted, she should continue 50 mg daily then.

## 2017-10-27 DIAGNOSIS — H2512 Age-related nuclear cataract, left eye: Secondary | ICD-10-CM | POA: Diagnosis not present

## 2017-11-07 ENCOUNTER — Encounter: Payer: Self-pay | Admitting: *Deleted

## 2017-11-08 ENCOUNTER — Ambulatory Visit: Payer: Medicare Other | Admitting: Certified Registered"

## 2017-11-08 ENCOUNTER — Encounter
Admission: RE | Disposition: A | Discharge status: Home / Self Care | Payer: Self-pay | Source: Ambulatory Visit | Attending: Ophthalmology

## 2017-11-08 ENCOUNTER — Encounter: Payer: Self-pay | Admitting: *Deleted

## 2017-11-08 ENCOUNTER — Ambulatory Visit
Admission: RE | Admit: 2017-11-08 | Discharge: 2017-11-08 | Disposition: A | Discharge status: Home / Self Care | Payer: Medicare Other | Source: Ambulatory Visit | Attending: Ophthalmology | Admitting: Ophthalmology

## 2017-11-08 ENCOUNTER — Other Ambulatory Visit: Payer: Self-pay

## 2017-11-08 DIAGNOSIS — Z7982 Long term (current) use of aspirin: Secondary | ICD-10-CM | POA: Diagnosis not present

## 2017-11-08 DIAGNOSIS — F17200 Nicotine dependence, unspecified, uncomplicated: Secondary | ICD-10-CM | POA: Diagnosis not present

## 2017-11-08 DIAGNOSIS — Z79899 Other long term (current) drug therapy: Secondary | ICD-10-CM | POA: Diagnosis not present

## 2017-11-08 DIAGNOSIS — H2512 Age-related nuclear cataract, left eye: Secondary | ICD-10-CM | POA: Insufficient documentation

## 2017-11-08 DIAGNOSIS — F329 Major depressive disorder, single episode, unspecified: Secondary | ICD-10-CM | POA: Insufficient documentation

## 2017-11-08 DIAGNOSIS — I739 Peripheral vascular disease, unspecified: Secondary | ICD-10-CM | POA: Diagnosis not present

## 2017-11-08 HISTORY — DX: Dyspnea, unspecified: R06.00

## 2017-11-08 HISTORY — PX: CATARACT EXTRACTION W/PHACO: SHX586

## 2017-11-08 SURGERY — PHACOEMULSIFICATION, CATARACT, WITH IOL INSERTION
Anesthesia: Monitor Anesthesia Care | Site: Eye | Laterality: Left | Wound class: Clean

## 2017-11-08 MED ORDER — MOXIFLOXACIN HCL 0.5 % OP SOLN
OPHTHALMIC | Status: DC | PRN
  Administered 2017-11-08: 0.2 mL via OPHTHALMIC

## 2017-11-08 MED ORDER — CARBACHOL 0.01 % IO SOLN
INTRAOCULAR | Status: DC | PRN
  Administered 2017-11-08: 0.5 mL via INTRAOCULAR

## 2017-11-08 MED ORDER — ARMC OPHTHALMIC DILATING DROPS
1.0000 "application " | OPHTHALMIC | Status: AC
  Administered 2017-11-08 (×3): 1 via OPHTHALMIC

## 2017-11-08 MED ORDER — NA CHONDROIT SULF-NA HYALURON 40-17 MG/ML IO SOLN
INTRAOCULAR | Status: AC
Start: 2017-11-08 — End: 2017-11-08
  Filled 2017-11-08: qty 1

## 2017-11-08 MED ORDER — MOXIFLOXACIN HCL 0.5 % OP SOLN: 1.0000 [drp] | OPHTHALMIC | Status: DC | PRN

## 2017-11-08 MED ORDER — ARMC OPHTHALMIC DILATING DROPS
OPHTHALMIC | Status: AC
  Administered 2017-11-08: 1 via OPHTHALMIC
  Filled 2017-11-08: qty 0.4

## 2017-11-08 MED ORDER — LIDOCAINE HCL (PF) 4 % IJ SOLN
INTRAOCULAR | Status: DC | PRN
  Administered 2017-11-08: 4 mL via OPHTHALMIC

## 2017-11-08 MED ORDER — MIDAZOLAM HCL 2 MG/2ML IJ SOLN
INTRAMUSCULAR | Status: DC | PRN
Start: 2017-11-08 — End: 2017-11-08
  Administered 2017-11-08: 1 mg via INTRAVENOUS

## 2017-11-08 MED ORDER — LIDOCAINE HCL (PF) 4 % IJ SOLN
INTRAMUSCULAR | Status: AC
  Filled 2017-11-08: qty 5

## 2017-11-08 MED ORDER — EPINEPHRINE PF 1 MG/ML IJ SOLN
INTRAOCULAR | Status: DC | PRN
  Administered 2017-11-08: 10:00:00 via OPHTHALMIC

## 2017-11-08 MED ORDER — POVIDONE-IODINE 5 % OP SOLN
OPHTHALMIC | Status: DC | PRN
  Administered 2017-11-08: 1 via OPHTHALMIC

## 2017-11-08 MED ORDER — POVIDONE-IODINE 5 % OP SOLN
OPHTHALMIC | Status: AC
  Filled 2017-11-08: qty 30

## 2017-11-08 MED ORDER — MOXIFLOXACIN HCL 0.5 % OP SOLN
OPHTHALMIC | Status: AC
  Filled 2017-11-08: qty 3

## 2017-11-08 MED ORDER — EPINEPHRINE PF 1 MG/ML IJ SOLN
INTRAMUSCULAR | Status: AC
  Filled 2017-11-08: qty 2

## 2017-11-08 MED ORDER — SODIUM CHLORIDE 0.9 % IV SOLN
INTRAVENOUS | Status: DC
  Administered 2017-11-08: 09:00:00 via INTRAVENOUS

## 2017-11-08 MED ORDER — NA CHONDROIT SULF-NA HYALURON 40-17 MG/ML IO SOLN
INTRAOCULAR | Status: DC | PRN
  Administered 2017-11-08: 1 mL via INTRAOCULAR

## 2017-11-08 SURGICAL SUPPLY — 16 items
GLOVE BIO SURGEON STRL SZ8 (GLOVE) ×3 IMPLANT
GLOVE BIOGEL M 6.5 STRL (GLOVE) ×3 IMPLANT
GLOVE SURG LX 8.0 MICRO (GLOVE) ×2
GLOVE SURG LX STRL 8.0 MICRO (GLOVE) ×1 IMPLANT
GOWN STRL REUS W/ TWL LRG LVL3 (GOWN DISPOSABLE) ×2 IMPLANT
GOWN STRL REUS W/TWL LRG LVL3 (GOWN DISPOSABLE) ×6
LABEL CATARACT MEDS ST (LABEL) ×3 IMPLANT
LENS IOL TECNIS ITEC 21.5 (Intraocular Lens) ×2 IMPLANT
PACK CATARACT (MISCELLANEOUS) ×3 IMPLANT
PACK CATARACT BRASINGTON LX (MISCELLANEOUS) ×3 IMPLANT
PACK EYE AFTER SURG (MISCELLANEOUS) ×3 IMPLANT
SOL BSS BAG (MISCELLANEOUS) ×3
SOLUTION BSS BAG (MISCELLANEOUS) ×1 IMPLANT
SYR 5ML LL (SYRINGE) ×3 IMPLANT
WATER STERILE IRR 250ML POUR (IV SOLUTION) ×3 IMPLANT
WIPE NON LINTING 3.25X3.25 (MISCELLANEOUS) ×3 IMPLANT

## 2017-11-08 NOTE — Transfer of Care (Signed)
Immediate Anesthesia Transfer of Care Note  Patient: Pamela Moon  Procedure(s) Performed: CATARACT EXTRACTION PHACO AND INTRAOCULAR LENS PLACEMENT (IOC) (Left Eye)  Patient Location: Short Stay  Anesthesia Type:MAC  Level of Consciousness: awake and alert   Airway & Oxygen Therapy: Patient Spontanous Breathing  Post-op Assessment: Report given to RN and Post -op Vital signs reviewed and stable  Post vital signs: Reviewed  Last Vitals:  Vitals:   11/08/17 0858 11/08/17 1021  BP:  130/77  Pulse:  68  Resp:  16  Temp:  36.7 C  SpO2: 99% 99%    Last Pain:  Vitals:   11/08/17 0852  TempSrc: Temporal         Complications: No apparent anesthesia complications

## 2017-11-08 NOTE — H&P (Signed)
All labs reviewed. Abnormal studies sent to patients PCP when indicated.  Previous H&P reviewed, patient examined, there are NO CHANGES.  Pamela Moon LOUIS11/13/20189:57 AM

## 2017-11-08 NOTE — Op Note (Signed)
PREOPERATIVE DIAGNOSIS:  Nuclear sclerotic cataract of the left eye.   POSTOPERATIVE DIAGNOSIS:  Nuclear sclerotic cataract of the left eye.   OPERATIVE PROCEDURE: Procedure(s): CATARACT EXTRACTION PHACO AND INTRAOCULAR LENS PLACEMENT (IOC)   SURGEON:  Galen ManilaWilliam Daysha Ashmore, MD.   ANESTHESIA:  Anesthesiologist: Lenard SimmerKarenz, Andrew, MD CRNA: Mathews ArgyleLogan, Benjamin, CRNA  1.      Managed anesthesia care. 2.     0.591ml of Shugarcaine was instilled following the paracentesis   COMPLICATIONS:  None.   TECHNIQUE:   Stop and chop   DESCRIPTION OF PROCEDURE:  The patient was examined and consented in the preoperative holding area where the aforementioned topical anesthesia was applied to the left eye and then brought back to the Operating Room where the left eye was prepped and draped in the usual sterile ophthalmic fashion and a lid speculum was placed. A paracentesis was created with the side port blade and the anterior chamber was filled with viscoelastic. A near clear corneal incision was performed with the steel keratome. A continuous curvilinear capsulorrhexis was performed with a cystotome followed by the capsulorrhexis forceps. Hydrodissection and hydrodelineation were carried out with BSS on a blunt cannula. The lens was removed in a stop and chop  technique and the remaining cortical material was removed with the irrigation-aspiration handpiece. The capsular bag was inflated with viscoelastic and the Technis [id number] lens was placed in the capsular bag without complication. The remaining viscoelastic was removed from the eye with the irrigation-aspiration handpiece. The wounds were hydrated. The anterior chamber was flushed with Miostat and the eye was inflated to physiologic pressure. 0.151ml Vigamox was placed in the anterior chamber. The wounds were found to be water tight. The eye was dressed with Vigamox. The patient was given protective glasses to wear throughout the day and a shield with which to sleep  tonight. The patient was also given drops with which to begin a drop regimen today and will follow-up with me in one day. Implant Name Type Inv. Item Serial No. Manufacturer Lot No. LRB No. Used  LENS IOL DIOP 21.5 - [id number] Intraocular Lens LENS IOL DIOP 21.5 [id number] AMO  Left 1    Procedure(s) with comments: CATARACT EXTRACTION PHACO AND INTRAOCULAR LENS PLACEMENT (IOC) (Left) - US 01:02.2 AP% 19.8 CDE 12.39 Fluid Pack lot # [id number]  Electronically signed: Niyati Heinke LOUIS 11/08/2017 10:19 AM

## 2017-11-08 NOTE — Anesthesia Preprocedure Evaluation (Signed)
Anesthesia Evaluation  Patient identified by MRN, date of birth, ID band Patient awake    Reviewed: Allergy & Precautions, H&P , NPO status , Patient's Chart, lab work & pertinent test results, reviewed documented beta blocker date and time   History of Anesthesia Complications Negative for: history of anesthetic complications  Airway Mallampati: I  TM Distance: >3 FB Neck ROM: full    Dental  (+) Edentulous Upper, Edentulous Lower, Upper Dentures, Lower Dentures   Pulmonary shortness of breath and with exertion, neg sleep apnea, neg COPD, neg recent URI, Current Smoker,           Cardiovascular Exercise Tolerance: Good (-) hypertension(-) angina+ Peripheral Vascular Disease  (-) CAD, (-) Past MI, (-) Cardiac Stents and (-) CABG (-) dysrhythmias (-) Valvular Problems/Murmurs     Neuro/Psych PSYCHIATRIC DISORDERS negative neurological ROS     GI/Hepatic negative GI ROS, Neg liver ROS,   Endo/Other  negative endocrine ROS  Renal/GU negative Renal ROS  negative genitourinary   Musculoskeletal   Abdominal   Peds  Hematology negative hematology ROS (+)   Anesthesia Other Findings Past Medical History: No date: Allergy No date: Arthritis No date: Chicken pox No date: Depression No date: Dyspnea     Comment:  WITH EXERTION No date: History of nuclear stress test     Comment:  a. 12/2014: no ischemia, nl EF No date: Hyperlipidemia No date: PAD (peripheral artery disease) ([place])     Comment:  a. 12/2014 LE arterial doppler: ABI of 0.78 on the right               and 0.89 on the left, possible inflow disease on the left              side. There was a focal greater than 50% stenosis in the               mid right SFA with a peak velocity of 320;  b. 01/2016               ABI: R: 0.90, L: 0.90. No date: Palpitations No date: Tobacco abuse     Comment:  a. ongoing, 50+ pack years   Reproductive/Obstetrics negative OB  ROS                             Anesthesia Physical Anesthesia Plan  ASA: III  Anesthesia Plan: MAC   Post-op Pain Management:    Induction: Intravenous  PONV Risk Score and Plan: 1  Airway Management Planned: Nasal Cannula  Additional Equipment:   Intra-op Plan:   Post-operative Plan:   Informed Consent: I have reviewed the patients History and Physical, chart, labs and discussed the procedure including the risks, benefits and alternatives for the proposed anesthesia with the patient or authorized representative who has indicated his/her understanding and acceptance.   Dental Advisory Given  Plan Discussed with: Anesthesiologist, CRNA and Surgeon  Anesthesia Plan Comments:         Anesthesia Quick Evaluation

## 2017-11-08 NOTE — Anesthesia Postprocedure Evaluation (Signed)
Anesthesia Post Note  Patient: Pamela Moon  Procedure(s) Performed: CATARACT EXTRACTION PHACO AND INTRAOCULAR LENS PLACEMENT (IOC) (Left Eye)  Patient location during evaluation: PACU Anesthesia Type: MAC Level of consciousness: awake and alert Pain management: pain level controlled Vital Signs Assessment: post-procedure vital signs reviewed and stable Respiratory status: spontaneous breathing, nonlabored ventilation, respiratory function stable and patient connected to nasal cannula oxygen Cardiovascular status: stable and blood pressure returned to baseline Postop Assessment: no apparent nausea or vomiting Anesthetic complications: no     Last Vitals:  Vitals:   11/08/17 0858 11/08/17 1021  BP:  130/77  Pulse:  68  Resp:  16  Temp:  36.7 C  SpO2: 99% 99%    Last Pain:  Vitals:   11/08/17 0852  TempSrc: Temporal                 Brantley Fling

## 2017-11-08 NOTE — Discharge Instructions (Signed)
Eye Surgery Discharge Instructions  Expect mild scratchy sensation or mild soreness. DO NOT RUB YOUR EYE!  The day of surgery:  Minimal physical activity, but bed rest is not required  No reading, computer work, or close hand work  No bending, lifting, or straining.  May watch TV  For 24 hours:  No driving, legal decisions, or alcoholic beverages  Safety precautions  Eat anything you prefer: It is better to start with liquids, then soup then solid foods.  _____ Eye patch should be worn until postoperative exam tomorrow.  ____ Solar shield eyeglasses should be worn for comfort in the sunlight/patch while sleeping  Resume all regular medications including aspirin or Coumadin if these were discontinued prior to surgery. You may shower, bathe, shave, or wash your hair. Tylenol may be taken for mild discomfort.  Call your doctor if you experience significant pain, nausea, or vomiting, fever > 101 or other signs of infection. [phone] or [phone] Specific instructions:  Follow-up Information    Pamela Moon, William, MD Follow up.   Specialty:  Ophthalmology Why:  November 14 at 1:45pm Contact information: [address] [place] [address] [phone]          Eye Surgery Discharge Instructions  Expect mild scratchy sensation or mild soreness. DO NOT RUB YOUR EYE!  The day of surgery:  Minimal physical activity, but bed rest is not required  No reading, computer work, or close hand work  No bending, lifting, or straining.  May watch TV  For 24 hours:  No driving, legal decisions, or alcoholic beverages  Safety precautions  Eat anything you prefer: It is better to start with liquids, then soup then solid foods.  _____ Eye patch should be worn until postoperative exam tomorrow.  ____ Solar shield eyeglasses should be worn for comfort in the sunlight/patch while sleeping  Resume all regular medications including aspirin or Coumadin if these  were discontinued prior to surgery. You may shower, bathe, shave, or wash your hair. Tylenol may be taken for mild discomfort.  Call your doctor if you experience significant pain, nausea, or vomiting, fever > 101 or other signs of infection. [phone] or [phone] Specific instructions:  Follow-up Information    Pamela Moon, William, MD Follow up.   Specialty:  Ophthalmology Why:  November 14 at 1:45pm Contact information: [address] [place] [address] [phone]

## 2017-11-08 NOTE — Anesthesia Post-op Follow-up Note (Signed)
Anesthesia QCDR form completed.        

## 2017-11-09 ENCOUNTER — Encounter: Payer: Self-pay | Admitting: Ophthalmology

## 2017-11-11 DIAGNOSIS — Z23 Encounter for immunization: Secondary | ICD-10-CM | POA: Diagnosis not present

## 2017-12-28 ENCOUNTER — Telehealth: Payer: Self-pay | Admitting: Primary Care

## 2017-12-28 NOTE — Telephone Encounter (Signed)
Noted  

## 2017-12-28 NOTE — Telephone Encounter (Signed)
Noted, I'll remove it from her med list. Any reason she stopped taking the Zoloft? Did she wean herself off or did she abruptly stop it? When did she stop taking?

## 2017-12-28 NOTE — Telephone Encounter (Addendum)
Spoken to patient. She stated that Zoloft is not for her and does not want anything right now. Patient abruptly stop about 1 week before Christmas.

## 2017-12-28 NOTE — Telephone Encounter (Signed)
Copied from CRM [id number]. Topic: Quick Communication - See Telephone Encounter >> Dec 28, 2017 12:37 PM Rudi CocoLathan, Aurorah Schlachter M, NT wrote: CRM for notification. See Telephone encounter for:   12/28/17. Pt calling to let Np Vernona RiegerKatherine Clark  know that she has stop taking med. Zoloft and doesn't want it refilled any questions pt. Can be reached at [phone]

## 2018-06-19 DIAGNOSIS — H43813 Vitreous degeneration, bilateral: Secondary | ICD-10-CM | POA: Diagnosis not present

## 2018-10-18 ENCOUNTER — Ambulatory Visit (INDEPENDENT_AMBULATORY_CARE_PROVIDER_SITE_OTHER): Payer: Medicare Other

## 2018-10-18 DIAGNOSIS — Z23 Encounter for immunization: Secondary | ICD-10-CM | POA: Diagnosis not present

## 2019-05-17 ENCOUNTER — Telehealth: Payer: Self-pay

## 2019-05-17 NOTE — Telephone Encounter (Signed)
Called patient.  No answer. LMOV.  Need to change appointment to an Evisit. 

## 2019-05-23 NOTE — Telephone Encounter (Signed)
Called patient to make her aware that we have the okay to see her at 3:00 6/2 in office if patient is agreeable.  If patient does not want this in office appointment we can place a recall for Aug.

## 2019-05-29 ENCOUNTER — Ambulatory Visit: Payer: Self-pay | Admitting: Cardiovascular Disease

## 2019-06-20 ENCOUNTER — Telehealth: Payer: Self-pay

## 2019-06-20 NOTE — Telephone Encounter (Signed)
Called to ask patient COVID screening questions.  No answer. No VM.   

## 2019-06-22 ENCOUNTER — Telehealth: Payer: Self-pay | Admitting: Cardiovascular Disease

## 2019-06-22 NOTE — Telephone Encounter (Signed)

## 2019-06-23 NOTE — Progress Notes (Signed)
Cardiology Office Note  Date:  06/25/2019   ID:  Yailin, Biederman 12-20-1941, MRN [id number]  PCP:  Pleas Koch, NP   Chief Complaint  Patient presents with  . 4 month F/U    HPI:  Ms. Gillean is a pleasant 78 year old woman with  50 year smoking history, 1 pack per day anorexia,  strong family history of coronary artery disease,  Previously seen in 2015 for abnormal EKG, shortness of breath and chest pain PAD  Still smoking 1 ppd No desire to quit  HBA1C 5.9 Total chol 173 LDL 82  Concerned about family history, everybody seems to have heart blockages She is having some chest pain with typical and atypical features No regular exercise Weight continues to run low  EKG personally reviewed by myself on todays visit Shows normal sinus rhythm rate 72 bpm left axis deviation  Other past medical history reviewed Nuclear stress test in January of 2016 showed no evidence of ischemia with normal ejection fraction.  palpitations. She had outpatient telemetry done which showed sinus rhythm with sinus tachycardia and no significant arrhythmia.   Repeat ABI was stable at 0.9 bilaterally.  Noninvasive vascular evaluation in 2016 showed mildly reduced ABI.  There was possible inflow disease on the left side. There was a focal greater than 50% stenosis in the mid right SFA with a peak velocity of 320.    PMH:   has a past medical history of Allergy, Arthritis, Chicken pox, Depression, Dyspnea, History of nuclear stress test, Hyperlipidemia, PAD (peripheral artery disease) ([place]), Palpitations, and Tobacco abuse.  PSH:    Past Surgical History:  Procedure Laterality Date  . ABDOMINAL HYSTERECTOMY    . CATARACT EXTRACTION Right 10/22/2014  . CATARACT EXTRACTION W/PHACO Left 11/08/2017   Procedure: CATARACT EXTRACTION PHACO AND INTRAOCULAR LENS PLACEMENT (IOC);  Surgeon: Birder Robson, MD;  Location: [facility] ORS;  Service: Ophthalmology;  Laterality: Left;  [place]  01:02.2 AP% 19.8 CDE 12.39 Fluid Pack lot # [id number]  . CHOLECYSTECTOMY      Current Outpatient Medications  Medication Sig Dispense Refill  . aspirin 81 MG tablet Take 81 mg every evening by mouth.     . loratadine (CLARITIN) 10 MG tablet Take 10 mg daily as needed by mouth for allergies.     No current facility-administered medications for this visit.      Allergies:   Patient has no known allergies.   Social History:  The patient  reports that she has been smoking cigarettes. She has a 50.00 pack-year smoking history. She has never used smokeless tobacco. She reports that she does not drink alcohol or use drugs.   Family History:   family history includes Alcohol abuse in her brother and sister; Alzheimer's disease in her sister; Heart disease in her brother, mother, and sister; Hyperlipidemia in her brother, brother, and mother; Hypertension in her mother and sister; Stroke in her brother.    Review of Systems: Review of Systems  Constitutional: Negative.   Respiratory: Negative.   Cardiovascular: Negative.   Gastrointestinal: Negative.   Musculoskeletal: Negative.   Neurological: Negative.   Psychiatric/Behavioral: Negative.   All other systems reviewed and are negative.    PHYSICAL EXAM: VS:  BP 120/80 (BP Location: Left Arm, Patient Position: Sitting, Cuff Size: Normal)   Pulse 72   Temp 98.2 F (36.8 C)   Wt 100 lb 4 oz (45.5 kg)   SpO2 98%   BMI 16.18 kg/m  , BMI Body mass index is 16.18  kg/m. GEN: Well nourished, well developed, in no acute distress  HEENT: normal  Neck: no JVD, carotid bruits, or masses Cardiac: RRR; no murmurs, rubs, or gallops,no edema  Respiratory:  clear to auscultation bilaterally, normal work of breathing GI: soft, nontender, nondistended, + BS MS: no deformity or atrophy  Skin: warm and dry, no rash Neuro:  Strength and sensation are intact Psych: euthymic mood, full affect   Recent Labs: No results found for requested labs  within last 8760 hours.    Lipid Panel Lab Results  Component Value Date   CHOL 173 08/11/2017   HDL 76.80 08/11/2017   LDLCALC 82 08/11/2017   TRIG 71.0 08/11/2017      Wt Readings from Last 3 Encounters:  06/25/19 100 lb 4 oz (45.5 kg)  11/08/17 102 lb (46.3 kg)  09/28/17 98 lb 12.8 oz (44.8 kg)       ASSESSMENT AND PLAN:  PAD (peripheral artery disease) (HCC) - Chronic claudication Needs to quit smoking  Mixed hyperlipidemia - Low numbers , not on a statin  Smoker - We have encouraged her to continue to work on weaning her cigarettes and smoking cessation. She will continue to work on this and does not want any assistance with chantix.   Chest pain Typical and atypical features, lexiscan myoview Has been ordered   Disposition: We will call her with the results of the stress test  No orders of the defined types were placed in this encounter.    Signed, Dossie Arbourim Zyheir Daft, M.D., Ph.D. 06/25/2019  [facility], [place] [phone]

## 2019-06-25 ENCOUNTER — Encounter: Payer: Self-pay | Admitting: Cardiovascular Disease

## 2019-06-25 ENCOUNTER — Ambulatory Visit (INDEPENDENT_AMBULATORY_CARE_PROVIDER_SITE_OTHER): Payer: Medicare Other | Admitting: Cardiovascular Disease

## 2019-06-25 ENCOUNTER — Other Ambulatory Visit: Payer: Self-pay

## 2019-06-25 VITALS — BP 120/80 | HR 72 | Temp 98.2°F | Wt 100.2 lb

## 2019-06-25 DIAGNOSIS — R0789 Other chest pain: Secondary | ICD-10-CM

## 2019-06-25 DIAGNOSIS — E782 Mixed hyperlipidemia: Secondary | ICD-10-CM

## 2019-06-25 DIAGNOSIS — R079 Chest pain, unspecified: Secondary | ICD-10-CM

## 2019-06-25 DIAGNOSIS — I739 Peripheral vascular disease, unspecified: Secondary | ICD-10-CM | POA: Diagnosis not present

## 2019-06-25 DIAGNOSIS — F17200 Nicotine dependence, unspecified, uncomplicated: Secondary | ICD-10-CM | POA: Diagnosis not present

## 2019-06-25 NOTE — Patient Instructions (Addendum)
Smoking cessation  Medication Instructions:  No changes  If you need a refill on your cardiac medications before your next appointment, please call your pharmacy.   Lab work: No new labs needed   If you have labs (blood work) drawn today and your tests are completely normal, you will receive your results only by: Marland Kitchen. MyChart Message (if you have MyChart) OR . A paper copy in the mail If you have any lab test that is abnormal or we need to change your treatment, we will call you to review the results.   Testing/Procedures: We will schedule a lexiscan myoview for chest pain [facility] MYOVIEW  Your caregiver has ordered a Stress Test with nuclear imaging. The purpose of this test is to evaluate the blood supply to your heart muscle. This procedure is referred to as a "Non-Invasive Stress Test." This is because other than having an IV started in your vein, nothing is inserted or "invades" your body. Cardiac stress tests are done to find areas of poor blood flow to the heart by determining the extent of coronary artery disease (CAD). Some patients exercise on a treadmill, which naturally increases the blood flow to your heart, while others who are  unable to walk on a treadmill due to physical limitations have a pharmacologic/chemical stress agent called Lexiscan . This medicine will mimic walking on a treadmill by temporarily increasing your coronary blood flow.   Please note: these test may take anywhere between 2-4 hours to complete  [facility]  THE VOLUNTEERS AT THE FIRST DESK WILL DIRECT YOU WHERE TO GO  Date of Procedure:_____________________________________  Arrival Time for Procedure:______________________________   PLEASE NOTIFY THE OFFICE AT LEAST 24 HOURS IN ADVANCE IF YOU ARE UNABLE TO KEEP YOUR APPOINTMENT.  [phone] AND  PLEASE NOTIFY [facility] AT [facility] AT LEAST 24 HOURS IN ADVANCE IF YOU ARE UNABLE TO KEEP YOUR APPOINTMENT.  [phone]  How to prepare for your Myoview test:  1. Do not eat or drink after midnight 2. No caffeine for 24 hours prior to test 3. No smoking 24 hours prior to test. 4. Your medication may be taken with water.  If your doctor stopped a medication because of this test, do not take that medication. 5. Ladies, please do not wear dresses.  Skirts or pants are appropriate. Please wear a short sleeve shirt. 6. No perfume, cologne or lotion. 7. Wear comfortable walking shoes. No heels!   PLEASE CALL [phone] WHEN YOU WANT TO SCHEDULE    Follow-Up: At [facility], you and your health needs are our priority.  As part of our continuing mission to provide you with exceptional heart care, we have created designated Provider Care Teams.  These Care Teams include your primary Cardiologist (physician) and Advanced Practice Providers (APPs -  Physician Assistants and Nurse Practitioners) who all work together to provide you with the care you need, when you need it.  . You will need a follow up appointment in 12 months .   Please call our office 2 months in advance to schedule this appointment.    . Providers on your designated Care Team:   . Nicolasa Duckinghristopher Berge, NP . Eula Listenyan Dunn, PA-C . Marisue IvanJacquelyn Visser, PA-C  Any Other Special Instructions Will Be Listed Below (If Applicable).  For educational health videos Log in to : [email or web address] Or : [email or web address], password : [email or web address]   Steps to Quit Smoking Smoking tobacco is the leading cause of preventable death. It can  affect almost every organ in the body. Smoking puts you and people around you at risk for many serious, long-lasting (chronic) diseases. Quitting smoking can be hard, but it is one of the best things that you can do for your health. It is never too late to quit. How do I get ready to quit? When you decide to quit smoking, make a plan to help you succeed. Before you quit:  Pick a date to quit. Set a date within the next 2 weeks  to give you time to prepare.  Write down the reasons why you are quitting. Keep this list in places where you will see it often.  Tell your family, friends, and co-workers that you are quitting. Their support is important.  Talk with your doctor about the choices that may help you quit.  Find out if your health insurance will pay for these treatments.  Know the people, places, things, and activities that make you want to smoke (triggers). Avoid them. What first steps can I take to quit smoking?  Throw away all cigarettes at home, at work, and in your car.  Throw away the things that you use when you smoke, such as ashtrays and lighters.  Clean your car. Make sure to empty the ashtray.  Clean your home, including curtains and carpets. What can I do to help me quit smoking? Talk with your doctor about taking medicines and seeing a counselor at the same time. You are more likely to succeed when you do both.  If you are pregnant or breastfeeding, talk with your doctor about counseling or other ways to quit smoking. Do not take medicine to help you quit smoking unless your doctor tells you to do so. To quit smoking: Quit right away  Quit smoking totally, instead of slowly cutting back on how much you smoke over a period of time.  Go to counseling. You are more likely to quit if you go to counseling sessions regularly. Take medicine You may take medicines to help you quit. Some medicines need a prescription, and some you can buy over-the-counter. Some medicines may contain a drug called nicotine to replace the nicotine in cigarettes. Medicines may:  Help you to stop having the desire to smoke (cravings).  Help to stop the problems that come when you stop smoking (withdrawal symptoms). Your doctor may ask you to use:  Nicotine patches, gum, or lozenges.  Nicotine inhalers or sprays.  Non-nicotine medicine that is taken by mouth. Find resources Find resources and other ways to  help you quit smoking and remain smoke-free after you quit. These resources are most helpful when you use them often. They include:  Online chats with a Veterinary surgeoncounselor.  Phone quitlines.  Printed Materials engineerself-help materials.  Support groups or group counseling.  Text messaging programs.  Mobile phone apps. Use apps on your mobile phone or tablet that can help you stick to your quit plan. There are many free apps for mobile phones and tablets as well as websites. Examples include Quit Guide from the [employer] and smokefree.gov  What things can I do to make it easier to quit?   Talk to your family and friends. Ask them to support and encourage you.  Call a phone quitline (1-800-QUIT-NOW), reach out to support groups, or work with a Veterinary surgeoncounselor.  Ask people who smoke to not smoke around you.  Avoid places that make you want to smoke, such as: ? Bars. ? Parties. ? Smoke-break areas at work.  Spend time  with people who do not smoke.  Lower the stress in your life. Stress can make you want to smoke. Try these things to help your stress: ? Getting regular exercise. ? Doing deep-breathing exercises. ? Doing yoga. ? Meditating. ? Doing a body scan. To do this, close your eyes, focus on one area of your body at a time from head to toe. Notice which parts of your body are tense. Try to relax the muscles in those areas. How will I feel when I quit smoking? Day 1 to 3 weeks Within the first 24 hours, you may start to have some problems that come from quitting tobacco. These problems are very bad 2-3 days after you quit, but they do not often last for more than 2-3 weeks. You may get these symptoms:  Mood swings.  Feeling restless, nervous, angry, or annoyed.  Trouble concentrating.  Dizziness.  Strong desire for high-sugar foods and nicotine.  Weight gain.  Trouble pooping (constipation).  Feeling like you may vomit (nausea).  Coughing or a sore throat.  Changes in how the medicines that you  take for other issues work in your body.  Depression.  Trouble sleeping (insomnia). Week 3 and afterward After the first 2-3 weeks of quitting, you may start to notice more positive results, such as:  Better sense of smell and taste.  Less coughing and sore throat.  Slower heart rate.  Lower blood pressure.  Clearer skin.  Better breathing.  Fewer sick days. Quitting smoking can be hard. Do not give up if you fail the first time. Some people need to try a few times before they succeed. Do your best to stick to your quit plan, and talk with your doctor if you have any questions or concerns. Summary  Smoking tobacco is the leading cause of preventable death. Quitting smoking can be hard, but it is one of the best things that you can do for your health.  When you decide to quit smoking, make a plan to help you succeed.  Quit smoking right away, not slowly over a period of time.  When you start quitting, seek help from your doctor, family, or friends. This information is not intended to replace advice given to you by your health care provider. Make sure you discuss any questions you have with your health care provider. Document Released: 10/09/2009 Document Revised: 03/02/2019 Document Reviewed: 03/03/2019 Elsevier Patient Education  2020 [employer]  Smoking Tobacco Information, Adult Smoking tobacco can be harmful to your health. Tobacco contains a poisonous (toxic), colorless chemical called nicotine. Nicotine is addictive. It changes the brain and can make it hard to stop smoking. Tobacco also has other toxic chemicals that can hurt your body and raise your risk of many cancers. How can smoking tobacco affect me? Smoking tobacco puts you at risk for:  Cancer. Smoking is most commonly associated with lung cancer, but can also lead to cancer in other parts of the body.  Chronic obstructive pulmonary disease (COPD). This is a long-term lung condition that makes it hard to  breathe. It also gets worse over time.  High blood pressure (hypertension), heart disease, stroke, or heart attack.  Lung infections, such as pneumonia.  Cataracts. This is when the lenses in the eyes become clouded.  Digestive problems. This may include peptic ulcers, heartburn, and gastroesophageal reflux disease (GERD).  Oral health problems, such as gum disease and tooth loss.  Loss of taste and smell. Smoking can affect your appearance by causing:  Wrinkles.  Yellow or stained teeth, fingers, and fingernails. Smoking tobacco can also affect your social life, because:  It may be challenging to find places to smoke when away from home. Many workplaces, [employer], hotels, and public places are tobacco-free.  Smoking is expensive. This is due to the cost of tobacco and the long-term costs of treating health problems from smoking.  Secondhand smoke may affect those around you. Secondhand smoke can cause lung cancer, breathing problems, and heart disease. Children of smokers have a higher risk for: ? Sudden infant death syndrome (SIDS). ? Ear infections. ? Lung infections. If you currently smoke tobacco, quitting now can help you:  Lead a longer and healthier life.  Look, smell, breathe, and feel better over time.  Save money.  Protect others from the harms of secondhand smoke. What actions can I take to prevent health problems? Quit smoking   Do not start smoking. Quit if you already do.  Make a plan to quit smoking and commit to it. Look for programs to help you and ask your health care provider for recommendations and ideas.  Set a date and write down all the reasons you want to quit.  Let your friends and family know you are quitting so they can help and support you. Consider finding friends who also want to quit. It can be easier to quit with someone else, so that you can support each other.  Talk with your health care provider about using nicotine replacement  medicines to help you quit, such as gum, lozenges, patches, sprays, or pills.  Do not replace cigarette smoking with electronic cigarettes, which are commonly called e-cigarettes. The safety of e-cigarettes is not known, and some may contain harmful chemicals.  If you try to quit but return to smoking, stay positive. It is common to slip up when you first quit, so take it one day at a time.  Be prepared for cravings. When you feel the urge to smoke, chew gum or suck on hard candy. Lifestyle  Stay busy and take care of your body.  Drink enough fluid to keep your urine pale yellow.  Get plenty of exercise and eat a healthy diet. This can help prevent weight gain after quitting.  Monitor your eating habits. Quitting smoking can cause you to have a larger appetite than when you smoke.  Find ways to relax. Go out with friends or family to a movie or a restaurant where people do not smoke.  Ask your health care provider about having regular tests (screenings) to check for cancer. This may include blood tests, imaging tests, and other tests.  Find ways to manage your stress, such as meditation, yoga, or exercise. Where to find support To get support to quit smoking, consider:  Asking your health care provider for more information and resources.  Taking classes to learn more about quitting smoking.  Looking for local organizations that offer resources about quitting smoking.  Joining a support group for people who want to quit smoking in your local community.  Calling the smokefree.gov counselor helpline: 1-800-Quit-Now 251-186-8673) Where to find more information You may find more information about quitting smoking from:  HelpGuide.org: www.helpguide.org  BankRights.uy: smokefree.gov  American Lung Association: www.lung.org Contact a health care provider if you:  Have problems breathing.  Notice that your lips, nose, or fingers turn blue.  Have chest pain.  Are coughing  up blood.  Feel faint or you pass out.  Have other health changes that cause you to  worry. Summary  Smoking tobacco can negatively affect your health, the health of those around you, your finances, and your social life.  Do not start smoking. Quit if you already do. If you need help quitting, ask your health care provider.  Think about joining a support group for people who want to quit smoking in your local community. There are many effective programs that will help you to quit this behavior. This information is not intended to replace advice given to you by your health care provider. Make sure you discuss any questions you have with your health care provider. Document Released: 12/28/2016 Document Revised: 02/01/2018 Document Reviewed: 12/28/2016 Elsevier Patient Education  2020 [employer].

## 2019-07-02 NOTE — Addendum Note (Signed)
Addended by: Raelene Bott, Kazzandra Desaulniers L on: 07/02/2019 02:00 PM   Modules accepted: Orders

## 2019-07-30 ENCOUNTER — Other Ambulatory Visit: Payer: Self-pay

## 2022-05-06 ENCOUNTER — Telehealth: Payer: Self-pay | Admitting: Cardiovascular Disease

## 2022-05-06 NOTE — Telephone Encounter (Signed)
3 attempts to schedule fu appt from recall list.   Deleting recall.
